# Patient Record
Sex: Male | Born: 1960
Health system: Southern US, Community
[De-identification: ages and names within clinical notes are randomized; demographics above are authoritative.]

## PROBLEM LIST (undated history)

## (undated) ENCOUNTER — Ambulatory Visit (HOSPITAL_COMMUNITY): Admission: EM | Discharge status: Against Medical Advice | Payer: Medicare Other

## (undated) DIAGNOSIS — J45909 Unspecified asthma, uncomplicated: Secondary | ICD-10-CM

## (undated) DIAGNOSIS — H543 Unqualified visual loss, both eyes: Secondary | ICD-10-CM

## (undated) HISTORY — PX: CYST EXCISION: SHX5701

---

## 2007-05-16 ENCOUNTER — Emergency Department (HOSPITAL_COMMUNITY): Admission: EM | Admit: 2007-05-16 | Discharge: 2007-05-16 | Payer: Self-pay | Admitting: Emergency Medicine

## 2007-08-14 ENCOUNTER — Emergency Department (HOSPITAL_COMMUNITY): Admission: EM | Admit: 2007-08-14 | Discharge: 2007-08-14 | Payer: Self-pay | Admitting: Family Medicine

## 2007-09-19 ENCOUNTER — Emergency Department (HOSPITAL_COMMUNITY): Admission: EM | Admit: 2007-09-19 | Discharge: 2007-09-19 | Payer: Self-pay | Admitting: Family Medicine

## 2007-12-25 ENCOUNTER — Emergency Department (HOSPITAL_COMMUNITY): Admission: EM | Admit: 2007-12-25 | Discharge: 2007-12-25 | Payer: Self-pay | Admitting: Emergency Medicine

## 2008-05-16 ENCOUNTER — Emergency Department (HOSPITAL_COMMUNITY): Admission: EM | Admit: 2008-05-16 | Discharge: 2008-05-16 | Payer: Self-pay | Admitting: Family Medicine

## 2008-11-18 ENCOUNTER — Emergency Department (HOSPITAL_COMMUNITY): Admission: EM | Admit: 2008-11-18 | Discharge: 2008-11-18 | Payer: Self-pay | Admitting: Emergency Medicine

## 2009-04-29 ENCOUNTER — Emergency Department (HOSPITAL_COMMUNITY): Admission: EM | Admit: 2009-04-29 | Discharge: 2009-04-29 | Payer: Self-pay | Admitting: Emergency Medicine

## 2009-05-21 ENCOUNTER — Emergency Department (HOSPITAL_COMMUNITY): Admission: EM | Admit: 2009-05-21 | Discharge: 2009-05-21 | Payer: Self-pay | Admitting: Emergency Medicine

## 2009-05-25 ENCOUNTER — Emergency Department (HOSPITAL_COMMUNITY): Admission: EM | Admit: 2009-05-25 | Discharge: 2009-05-25 | Payer: Self-pay | Admitting: Family Medicine

## 2009-12-17 DIAGNOSIS — H543 Unqualified visual loss, both eyes: Secondary | ICD-10-CM

## 2009-12-17 HISTORY — DX: Unqualified visual loss, both eyes: H54.3

## 2010-03-13 ENCOUNTER — Emergency Department (HOSPITAL_COMMUNITY): Admission: EM | Admit: 2010-03-13 | Discharge: 2010-03-13 | Payer: Self-pay | Admitting: Family Medicine

## 2010-03-26 ENCOUNTER — Emergency Department (HOSPITAL_COMMUNITY): Admission: EM | Admit: 2010-03-26 | Discharge: 2010-03-26 | Payer: Self-pay | Admitting: Family Medicine

## 2010-04-24 ENCOUNTER — Emergency Department (HOSPITAL_COMMUNITY): Admission: EM | Admit: 2010-04-24 | Discharge: 2010-04-24 | Payer: Self-pay | Admitting: Emergency Medicine

## 2010-04-27 ENCOUNTER — Emergency Department (HOSPITAL_COMMUNITY): Admission: EM | Admit: 2010-04-27 | Discharge: 2010-04-27 | Payer: Self-pay | Admitting: Emergency Medicine

## 2010-05-01 ENCOUNTER — Emergency Department (HOSPITAL_COMMUNITY): Admission: EM | Admit: 2010-05-01 | Discharge: 2010-05-01 | Payer: Self-pay | Admitting: Emergency Medicine

## 2010-08-08 ENCOUNTER — Ambulatory Visit: Payer: Self-pay | Admitting: Vascular Surgery

## 2010-08-08 ENCOUNTER — Encounter (INDEPENDENT_AMBULATORY_CARE_PROVIDER_SITE_OTHER): Payer: Self-pay | Admitting: Emergency Medicine

## 2010-08-08 ENCOUNTER — Emergency Department (HOSPITAL_COMMUNITY): Admission: EM | Admit: 2010-08-08 | Discharge: 2010-08-08 | Payer: Self-pay | Admitting: Emergency Medicine

## 2011-02-07 ENCOUNTER — Encounter (INDEPENDENT_AMBULATORY_CARE_PROVIDER_SITE_OTHER): Payer: Self-pay | Admitting: *Deleted

## 2011-03-02 LAB — COMPREHENSIVE METABOLIC PANEL
ALT: 36 U/L (ref 0–53)
AST: 28 U/L (ref 0–37)
Albumin: 3.6 g/dL (ref 3.5–5.2)
BUN: 10 mg/dL (ref 6–23)
Calcium: 8.8 mg/dL (ref 8.4–10.5)
Creatinine, Ser: 0.87 mg/dL (ref 0.4–1.5)
Glucose, Bld: 112 mg/dL — ABNORMAL HIGH (ref 70–99)
Total Bilirubin: 0.5 mg/dL (ref 0.3–1.2)
Total Protein: 6.9 g/dL (ref 6.0–8.3)

## 2011-03-02 LAB — CBC
Hemoglobin: 13.6 g/dL (ref 13.0–17.0)
MCH: 29.8 pg (ref 26.0–34.0)
MCV: 86.2 fL (ref 78.0–100.0)
Platelets: 277 10*3/uL (ref 150–400)
RDW: 14.4 % (ref 11.5–15.5)

## 2011-03-02 LAB — DIFFERENTIAL
Lymphs Abs: 1.5 10*3/uL (ref 0.7–4.0)
Monocytes Relative: 8 % (ref 3–12)

## 2011-03-02 LAB — URINALYSIS, ROUTINE W REFLEX MICROSCOPIC
Hgb urine dipstick: NEGATIVE
Protein, ur: NEGATIVE mg/dL
Specific Gravity, Urine: 1.02 (ref 1.005–1.030)
pH: 5.5 (ref 5.0–8.0)

## 2011-03-02 LAB — D-DIMER, QUANTITATIVE: D-Dimer, Quant: 2.11 ug/mL-FEU — ABNORMAL HIGH (ref 0.00–0.48)

## 2011-03-06 LAB — DIFFERENTIAL
Basophils Absolute: 0 10*3/uL (ref 0.0–0.1)
Basophils Relative: 0 % (ref 0–1)
Basophils Relative: 1 % (ref 0–1)
Eosinophils Absolute: 0.1 10*3/uL (ref 0.0–0.7)
Eosinophils Relative: 1 % (ref 0–5)
Eosinophils Relative: 1 % (ref 0–5)
Lymphocytes Relative: 12 % (ref 12–46)
Lymphocytes Relative: 18 % (ref 12–46)
Lymphocytes Relative: 19 % (ref 12–46)
Lymphs Abs: 1 10*3/uL (ref 0.7–4.0)
Monocytes Absolute: 0.2 10*3/uL (ref 0.1–1.0)
Monocytes Absolute: 0.2 10*3/uL (ref 0.1–1.0)
Monocytes Relative: 4 % (ref 3–12)
Monocytes Relative: 4 % (ref 3–12)
Neutro Abs: 4 10*3/uL (ref 1.7–7.7)
Neutro Abs: 4.1 10*3/uL (ref 1.7–7.7)
Neutrophils Relative %: 77 % (ref 43–77)

## 2011-03-06 LAB — CBC
HCT: 39.3 % (ref 39.0–52.0)
HCT: 42.8 % (ref 39.0–52.0)
Hemoglobin: 13.6 g/dL (ref 13.0–17.0)
MCHC: 34.7 g/dL (ref 30.0–36.0)
MCHC: 35.1 g/dL (ref 30.0–36.0)
MCV: 88.4 fL (ref 78.0–100.0)
MCV: 88.8 fL (ref 78.0–100.0)
MCV: 89.1 fL (ref 78.0–100.0)
Platelets: 125 10*3/uL — ABNORMAL LOW (ref 150–400)
RDW: 13.8 % (ref 11.5–15.5)
RDW: 14.1 % (ref 11.5–15.5)
RDW: 14.1 % (ref 11.5–15.5)
WBC: 5.2 10*3/uL (ref 4.0–10.5)

## 2011-03-06 LAB — BASIC METABOLIC PANEL
BUN: 9 mg/dL (ref 6–23)
Chloride: 100 mEq/L (ref 96–112)
Creatinine, Ser: 1.04 mg/dL (ref 0.4–1.5)

## 2011-03-06 LAB — COMPREHENSIVE METABOLIC PANEL
Albumin: 3.4 g/dL — ABNORMAL LOW (ref 3.5–5.2)
BUN: 13 mg/dL (ref 6–23)
Creatinine, Ser: 1.28 mg/dL (ref 0.4–1.5)
GFR calc Af Amer: >60 mL/min (ref >60)
Total Protein: 7.4 g/dL (ref 6.0–8.3)

## 2011-03-06 LAB — POCT I-STAT, CHEM 8
Creatinine, Ser: 1.3 mg/dL (ref 0.4–1.5)
HCT: 45 % (ref 39.0–52.0)
Sodium: 129 mEq/L — ABNORMAL LOW (ref 135–145)

## 2011-03-06 LAB — SEDIMENTATION RATE: Sed Rate: 55 mm/hr — ABNORMAL HIGH (ref 0–16)

## 2012-05-23 ENCOUNTER — Encounter (HOSPITAL_COMMUNITY): Payer: Self-pay | Admitting: *Deleted

## 2012-05-23 ENCOUNTER — Emergency Department (HOSPITAL_COMMUNITY)
Admission: EM | Admit: 2012-05-23 | Discharge: 2012-05-24 | Disposition: A | Discharge status: Home / Self Care | Payer: Medicaid Other | Attending: Emergency Medicine | Admitting: Emergency Medicine

## 2012-05-23 ENCOUNTER — Emergency Department (HOSPITAL_COMMUNITY): Payer: Medicaid Other

## 2012-05-23 DIAGNOSIS — R5381 Other malaise: Secondary | ICD-10-CM | POA: Insufficient documentation

## 2012-05-23 DIAGNOSIS — M545 Low back pain, unspecified: Secondary | ICD-10-CM | POA: Insufficient documentation

## 2012-05-23 DIAGNOSIS — IMO0002 Reserved for concepts with insufficient information to code with codable children: Secondary | ICD-10-CM | POA: Insufficient documentation

## 2012-05-23 DIAGNOSIS — X500XXA Overexertion from strenuous movement or load, initial encounter: Secondary | ICD-10-CM | POA: Insufficient documentation

## 2012-05-23 DIAGNOSIS — S3992XA Unspecified injury of lower back, initial encounter: Secondary | ICD-10-CM

## 2012-05-23 DIAGNOSIS — H543 Unqualified visual loss, both eyes: Secondary | ICD-10-CM | POA: Insufficient documentation

## 2012-05-23 DIAGNOSIS — R609 Edema, unspecified: Secondary | ICD-10-CM | POA: Insufficient documentation

## 2012-05-23 HISTORY — DX: Unspecified asthma, uncomplicated: J45.909

## 2012-05-23 HISTORY — DX: Unqualified visual loss, both eyes: H54.3

## 2012-05-23 MED ORDER — OXYCODONE-ACETAMINOPHEN 5-325 MG PO TABS
1.0000 | ORAL_TABLET | Freq: Once | ORAL | Status: AC
  Administered 2012-05-23: 1 via ORAL
  Filled 2012-05-23: qty 1

## 2012-05-23 MED ORDER — PREDNISONE 20 MG PO TABS
60.0000 mg | ORAL_TABLET | Freq: Once | ORAL | Status: AC
  Administered 2012-05-23: 60 mg via ORAL
  Filled 2012-05-23: qty 3

## 2012-05-23 MED ORDER — DIAZEPAM 5 MG PO TABS
5.0000 mg | ORAL_TABLET | Freq: Once | ORAL | Status: AC
  Administered 2012-05-23: 5 mg via ORAL
  Filled 2012-05-23: qty 1

## 2012-05-23 NOTE — ED Notes (Addendum)
Per EMS, pt from home, reports R lower back pain x 2 days, states pain is so severe that he is unable to stand up.  This pain, as reported, makes his RT leg weak and tingly.  VS by EMS: 124/90, 74, 18.  PT reported to EMS he can't amulate or tolerate sitting.

## 2012-05-23 NOTE — ED Notes (Signed)
Pt unable to make it more than 3-4 steps without pain and feeling like he's going to fall.  NP Dondra Spry made aware.

## 2012-05-23 NOTE — ED Provider Notes (Signed)
History     CSN: [id number]  Arrival date & time 05/23/12  1740   First MD Initiated Contact with Patient 05/23/12 2055      Chief Complaint  Patient presents with  . Back Pain    R lower back pain    (Consider location/radiation/quality/duration/timing/severity/associated sxs/prior treatment) HPI Comments: Patient reports, that when he was getting out of bed 2 days ago.  He felt a sharp, hop in his lower back since that time.  He has had constant pain with intermittent episodes of sudden weakness, where he has had to catch himself while ambulating.  Position doesn't seem to exacerbate the pain.  Nothing alleviates the pain has not taken any over-the-counter medication for this discomfort.  He was at his neurologist office yesterday for a "eye exam" and did not mention his low back pain to her.  He does not have her primary care physician  Patient is a 51 y.o. male presenting with back pain. The history is provided by the patient.  Back Pain  This is a new problem. The current episode started 2 days ago. The problem occurs constantly. The problem has not changed since onset.The pain is associated with twisting. The pain is present in the lumbar spine. The quality of the pain is described as stabbing. The pain radiates to the left thigh and right thigh. The pain is at a severity of 8/10. The pain is severe. The symptoms are aggravated by certain positions. Stiffness is present all day. Associated symptoms include leg pain and weakness. Pertinent negatives include no fever, no headaches, no bowel incontinence, no perianal numbness, no dysuria and no paresthesias. He has tried nothing for the symptoms. Risk factors include obesity.    Past Medical History  Diagnosis Date  . Asthma   . Blindness of both eyes     History reviewed. No pertinent past surgical history.  History reviewed. No pertinent family history.  History  Substance Use Topics  . Smoking status: Current Everyday Smoker  -- 0.5 packs/day  . Smokeless tobacco: Not on file  . Alcohol Use: Yes     occasionally      Review of Systems  Constitutional: Negative for fever.  Respiratory: Negative for cough and shortness of breath.   Gastrointestinal: Negative for nausea and bowel incontinence.  Genitourinary: Negative for dysuria, urgency, frequency, flank pain, decreased urine volume and testicular pain.  Musculoskeletal: Positive for back pain and gait problem.  Neurological: Positive for weakness. Negative for dizziness, headaches and paresthesias.    Allergies  Review of patient's allergies indicates no known allergies.  Home Medications   Current Outpatient Rx  Name Route Sig Dispense Refill  . DIAZEPAM 5 MG PO TABS Oral Take 1 tablet (5 mg total) by mouth every 8 (eight) hours as needed for anxiety. 30 tablet 0  . OXYCODONE-ACETAMINOPHEN 5-325 MG PO TABS Oral Take 1 tablet by mouth every 6 (six) hours as needed for pain. 30 tablet 0  . PREDNISONE 20 MG PO TABS Oral Take 3 tablets (60 mg total) by mouth daily. 12 tablet 0    BP 113/74  Pulse 89  Resp 15  SpO2 98%  Physical Exam  Constitutional: He appears well-developed and well-nourished.  HENT:  Head: Normocephalic and atraumatic.  Eyes:       Total blindness L legally blind in R eye  Neck: Normal range of motion.  Cardiovascular: Normal rate.   Pulmonary/Chest: Effort normal.  Abdominal: Soft. He exhibits no distension.  Musculoskeletal: He  exhibits edema.       Edema of lower legs that has been persistant for thwe past 6 months   Skin: Skin is warm.    ED Course  Procedures (including critical care time)  Labs Reviewed - No data to display Dg Lumbar Spine Complete  05/23/2012  *RADIOLOGY REPORT*  Clinical Data: Low back pain, radiculopathy  LUMBAR SPINE - COMPLETE 4+ VIEW  Comparison: CT abdomen pelvis dated 11/18/2008  Findings: Five lumbar-type vertebral bodies.  Normal lumbar lordosis.  No evidence of fracture or dislocation.   The vertebral body heights are maintained.  Mild degenerative changes of the lower lumbar spine.  Visualized bony pelvis appears intact.  IMPRESSION: No fracture or dislocation is seen.  Mild degenerative changes of the lower lumbar spine.  Original Report Authenticated By: Charline Bills, M.D.     1. Back injury    Patient was able to ambulate in the hall without difficulty   MDM  From the history presented most likely lumbar radiculopathy         Arman Filter, NP 05/24/12 0148  Arman Filter, NP 05/24/12 1610

## 2012-05-23 NOTE — ED Notes (Signed)
Pt also states for the last month he has been having stabbing pains to bilat heels.  Pt is blind in RT eye and almost blind in the LT.  Denies hx of diabetes.

## 2012-05-24 MED ORDER — OXYCODONE-ACETAMINOPHEN 5-325 MG PO TABS: 1.0000 | ORAL_TABLET | Freq: Four times a day (QID) | ORAL | Status: AC | PRN

## 2012-05-24 MED ORDER — DIAZEPAM 5 MG PO TABS: 5.0000 mg | ORAL_TABLET | Freq: Three times a day (TID) | ORAL | Status: AC | PRN

## 2012-05-24 MED ORDER — PREDNISONE 20 MG PO TABS: 60.0000 mg | ORAL_TABLET | Freq: Every day | ORAL | Status: AC

## 2012-05-24 NOTE — ED Provider Notes (Signed)
Medical screening examination/treatment/procedure(s) were performed by non-physician practitioner and as supervising physician I was immediately available for consultation/collaboration.  Doug Sou, MD 05/24/12 1455

## 2012-05-24 NOTE — ED Notes (Signed)
[facility] paged. 

## 2012-05-24 NOTE — ED Notes (Signed)
Pt able to ambulate in hallway.  NP Dondra Spry made aware.  Pt to be discharged.

## 2013-10-01 ENCOUNTER — Encounter (HOSPITAL_COMMUNITY): Payer: Self-pay | Admitting: Emergency Medicine

## 2013-10-01 ENCOUNTER — Emergency Department (HOSPITAL_COMMUNITY)
Admission: EM | Admit: 2013-10-01 | Discharge: 2013-10-01 | Disposition: A | Discharge status: Home / Self Care | Payer: Medicare Other | Attending: Emergency Medicine | Admitting: Emergency Medicine

## 2013-10-01 DIAGNOSIS — J45909 Unspecified asthma, uncomplicated: Secondary | ICD-10-CM | POA: Insufficient documentation

## 2013-10-01 DIAGNOSIS — M7989 Other specified soft tissue disorders: Secondary | ICD-10-CM | POA: Insufficient documentation

## 2013-10-01 DIAGNOSIS — T7840XA Allergy, unspecified, initial encounter: Secondary | ICD-10-CM

## 2013-10-01 DIAGNOSIS — R21 Rash and other nonspecific skin eruption: Secondary | ICD-10-CM | POA: Insufficient documentation

## 2013-10-01 DIAGNOSIS — L509 Urticaria, unspecified: Secondary | ICD-10-CM | POA: Insufficient documentation

## 2013-10-01 DIAGNOSIS — R209 Unspecified disturbances of skin sensation: Secondary | ICD-10-CM | POA: Insufficient documentation

## 2013-10-01 DIAGNOSIS — F17200 Nicotine dependence, unspecified, uncomplicated: Secondary | ICD-10-CM | POA: Insufficient documentation

## 2013-10-01 DIAGNOSIS — Z8669 Personal history of other diseases of the nervous system and sense organs: Secondary | ICD-10-CM | POA: Insufficient documentation

## 2013-10-01 MED ORDER — DIPHENHYDRAMINE HCL 50 MG/ML IJ SOLN
25.0000 mg | Freq: Once | INTRAMUSCULAR | Status: AC
  Administered 2013-10-01: 25 mg via INTRAVENOUS
  Filled 2013-10-01: qty 1

## 2013-10-01 MED ORDER — FAMOTIDINE IN NACL 20-0.9 MG/50ML-% IV SOLN
20.0000 mg | Freq: Once | INTRAVENOUS | Status: AC
  Administered 2013-10-01: 20 mg via INTRAVENOUS
  Filled 2013-10-01: qty 50

## 2013-10-01 MED ORDER — SODIUM CHLORIDE 0.9 % IV BOLUS (SEPSIS)
1000.0000 mL | Freq: Once | INTRAVENOUS | Status: AC
  Administered 2013-10-01: 1000 mL via INTRAVENOUS

## 2013-10-01 MED ORDER — DEXAMETHASONE SODIUM PHOSPHATE 10 MG/ML IJ SOLN
10.0000 mg | Freq: Once | INTRAMUSCULAR | Status: AC
  Administered 2013-10-01: 10 mg via INTRAVENOUS
  Filled 2013-10-01: qty 1

## 2013-10-01 NOTE — ED Provider Notes (Signed)
CSN: [id number]     Arrival date & time 10/01/13  0147 History   First MD Initiated Contact with Patient 10/01/13 0155     Chief Complaint  Patient presents with  . Allergic Reaction   (Consider location/radiation/quality/duration/timing/severity/associated sxs/prior Treatment) The history is provided by the patient and medical records.   This is a 52 year old male with past medical history significant for bilateral blindness and asthma, presenting to the ED for allergic reaction. Patient states around 10 PM he began having itching, swelling, and burning sensation on both of his hands. Patient does not recall direct contact with chemicals. 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 Patient denies any recent insect bite or plant exposure.  Patient was given 50 of Benadryl in route with some relief. Denies any shortness of breath, difficulty swallowing, or sensation of airway collapse. Patient has no known allergies.   Past Medical History  Diagnosis Date  . Asthma   . Blindness of both eyes    History reviewed. No pertinent past surgical history. No family history on file. History  Substance Use Topics  . Smoking status: Current Every Day Smoker -- 0.50 packs/day  . Smokeless tobacco: Not on file  . Alcohol Use: Yes     Comment: occasionally    Review of Systems  Skin: Positive for rash.  All other systems reviewed and are negative.    Allergies  Review of patient's allergies indicates no known allergies.  Home Medications  No current outpatient prescriptions on file. BP 105/54  Pulse 73  Temp(Src) 98 F (36.7 C) (Oral)  Resp 18  SpO2 97%  Physical Exam  Nursing note and vitals reviewed. Constitutional: He is oriented to person, place, and time. He appears well-developed and well-nourished. No distress.  HENT:  Head: Normocephalic and atraumatic.  Mouth/Throat: Uvula is midline,  oropharynx is clear and moist and mucous membranes are normal. No oral lesions. No uvula swelling. No oropharyngeal exudate, posterior oropharyngeal edema or posterior oropharyngeal erythema.  Oropharynx clear; no edema; airway patent; handling secretions appropriately  Eyes: Conjunctivae and EOM are normal. Pupils are equal, round, and reactive to light.  Neck: Normal range of motion. Neck supple.  Cardiovascular: Normal rate, regular rhythm and normal heart sounds.   Pulmonary/Chest: Effort normal and breath sounds normal. No respiratory distress. He has no wheezes.  Musculoskeletal: Normal range of motion.  Neurological: He is alert and oriented to person, place, and time.  Skin: Skin is warm and dry. Rash noted. Rash is urticarial. He is not diaphoretic.  Urticarial rash on bilateral hands and forearms; some swelling of hands; no chemical burns present  Psychiatric: He has a normal mood and affect.    ED Course  Procedures (including critical care time) Labs Review Labs Reviewed - No data to display Imaging Review No results found.  EKG Interpretation   None       MDM   1. Allergic reaction, initial encounter    Hives of both hands and forearm, no chemical burns identified.  Benadryl, pepcid, and decadron given.  Will re-assess.  5:25 AM Hives resolved, swelling greatly improved.  Minimal swelling of bilateral palms, worst along thenar eminences.  Pt afebrile, non-toxic appearing, NAD, VS stable- ok for discharge.  Instructed to continue taking OTC benadryl PRN itching.  May FU with [facility] if sx not completely resolved in a few days.  Discussed plan with pt, he agreed.  Return precautions advised.  Garlon Hatchet, PA-C 10/01/13 [phone]

## 2013-10-01 NOTE — ED Notes (Signed)
Bed: NW29 Expected date:  Expected time:  Means of arrival:  Comments: Bed 15, EMS, 19 M, Allergic Rxn

## 2013-10-01 NOTE — ED Notes (Signed)
[facility] called for transport.  

## 2013-10-01 NOTE — ED Notes (Signed)
Pt presents by EMS with c/o allergic reaction. Pt is unsure of what he had a reaction to but he started noticing symptoms around 10 pm last night. Pt says he noticed itching on his hands. Per EMS, pt is blind. Per EMS, pt does have swelling and hives on his hands. Pt given 50mg  PO benadryl en route by EMS.

## 2013-10-02 NOTE — ED Provider Notes (Signed)
Medical screening examination/treatment/procedure(s) were performed by non-physician practitioner and as supervising physician I was immediately available for consultation/collaboration.  Toy Baker, MD 10/02/13 [phone]

## 2013-10-06 ENCOUNTER — Encounter (HOSPITAL_COMMUNITY): Payer: Self-pay | Admitting: Emergency Medicine

## 2013-10-06 ENCOUNTER — Emergency Department (INDEPENDENT_AMBULATORY_CARE_PROVIDER_SITE_OTHER)
Admission: EM | Admit: 2013-10-06 | Discharge: 2013-10-06 | Disposition: A | Discharge status: Home / Self Care | Payer: Medicare Other | Source: Home / Self Care | Attending: Family Medicine | Admitting: Family Medicine

## 2013-10-06 ENCOUNTER — Emergency Department (INDEPENDENT_AMBULATORY_CARE_PROVIDER_SITE_OTHER): Payer: Medicare Other

## 2013-10-06 DIAGNOSIS — M65839 Other synovitis and tenosynovitis, unspecified forearm: Secondary | ICD-10-CM

## 2013-10-06 LAB — CBC WITH DIFFERENTIAL/PLATELET
Basophils Absolute: 0 10*3/uL (ref 0.0–0.1)
Basophils Relative: 0 % (ref 0–1)
Eosinophils Absolute: 0.1 10*3/uL (ref 0.0–0.7)
Eosinophils Relative: 2 % (ref 0–5)
Lymphocytes Relative: 38 % (ref 12–46)
MCV: 84.5 fL (ref 78.0–100.0)
Platelets: 255 10*3/uL (ref 150–400)
RDW: 13.3 % (ref 11.5–15.5)
WBC: 7.7 10*3/uL (ref 4.0–10.5)

## 2013-10-06 LAB — BASIC METABOLIC PANEL
Calcium: 9.5 mg/dL (ref 8.4–10.5)
GFR calc Af Amer: >90 mL/min (ref >90)
GFR calc non Af Amer: >90 mL/min (ref >90)
Sodium: 136 mEq/L (ref 135–145)

## 2013-10-06 MED ORDER — CEPHALEXIN 500 MG PO CAPS: 500.0000 mg | ORAL_CAPSULE | Freq: Four times a day (QID) | ORAL | Status: DC

## 2013-10-06 MED ORDER — METHYLPREDNISOLONE ACETATE 80 MG/ML IJ SUSP
INTRAMUSCULAR | Status: AC
  Filled 2013-10-06: qty 1

## 2013-10-06 MED ORDER — PERMETHRIN 5 % EX CREA: TOPICAL_CREAM | CUTANEOUS | Status: DC

## 2013-10-06 MED ORDER — DICLOFENAC 18 MG PO CAPS: 18.0000 mg | ORAL_CAPSULE | Freq: Three times a day (TID) | ORAL | Status: DC

## 2013-10-06 MED ORDER — METHYLPREDNISOLONE 4 MG PO KIT: PACK | ORAL | Status: DC

## 2013-10-06 MED ORDER — METHYLPREDNISOLONE ACETATE 40 MG/ML IJ SUSP
80.0000 mg | Freq: Once | INTRAMUSCULAR | Status: AC
  Administered 2013-10-06: 80 mg via INTRAMUSCULAR

## 2013-10-06 NOTE — ED Notes (Signed)
C/o right hand pain.   Patient was seen at [facility] for the hand pain which he thought was an allergic reaction.  Patient states he has not used any new products or food.  Patient states hand is real tender and that the left hand is starting to hurt.

## 2013-10-06 NOTE — ED Provider Notes (Signed)
CSN: [id number]     Arrival date & time 10/06/13  1447 History   First MD Initiated Contact with Patient 10/06/13 1633     Chief Complaint  Patient presents with  . Hand Pain   (Consider location/radiation/quality/duration/timing/severity/associated sxs/prior Treatment) Patient is a 52 y.o. male presenting with hand pain. The history is provided by the patient.  Hand Pain This is a new problem. 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 The problem has been gradually worsening.    Past Medical History  Diagnosis Date  . Asthma   . Blindness of both eyes    History reviewed. No pertinent past surgical history. History reviewed. No pertinent family history. History  Substance Use Topics  . Smoking status: Current Every Day Smoker -- 0.50 packs/day  . Smokeless tobacco: Not on file  . Alcohol Use: Yes     Comment: occasionally    Review of Systems  Constitutional: Negative.   Musculoskeletal: Positive for joint swelling.  Skin: Positive for rash.    Allergies  Review of patient's allergies indicates no known allergies.  Home Medications   Current Outpatient Rx  Name  Route  Sig  Dispense  Refill  . cephALEXin (KEFLEX) 500 MG capsule   Oral   Take 1 capsule (500 mg total) by mouth 4 (four) times daily. Take all of medicine and drink lots of fluids   20 capsule   0   . methylPREDNISolone (MEDROL DOSEPAK) 4 MG tablet      follow package directions, start on wed, take until finished.   21 tablet   0   . permethrin (ELIMITE) 5 % cream      Use as instructed on bottle, please instruct as pt is blind.   60 g   1    BP 107/77  Pulse 70  Temp(Src) 98.8 F (37.1 C) (Oral)  Resp 16  SpO2 100% Physical Exam  Nursing note and vitals reviewed. Constitutional: He is oriented to person, place, and time. He appears well-developed and well-nourished.  Musculoskeletal: He exhibits  tenderness.  Neurological: He is alert and oriented to person, place, and time.  Skin: Skin is warm and dry. No rash noted. There is erythema. No pallor.  Mid palmar tendon sheath erythema and tenderness,fingers intact, nv fxn intact.    ED Course  Procedures (including critical care time) Labs Review Labs Reviewed  CBC WITH DIFFERENTIAL  BASIC METABOLIC PANEL   Imaging Review Dg Hand Complete Right  10/06/2013   CLINICAL DATA:  Right hand pain without injury.  EXAM: RIGHT HAND - COMPLETE 3+ VIEW  COMPARISON:  None.  FINDINGS: There is no evidence of fracture or dislocation. There is no evidence of arthropathy or other focal bone abnormality. Soft tissues are unremarkable.  IMPRESSION: Negative.   Electronically Signed   By: Roque Lias M.D.   On: 10/06/2013 18:35      MDM  Discussed with dr Amanda Pea, plans as noted.seen in consult by dr Amanda Pea.    Linna Hoff, MD 10/06/13 [phone]

## 2013-10-07 NOTE — Consult Note (Signed)
NAMETRAMPAS, STETTNER                ACCOUNT NO.:  [id number]  MEDICAL RECORD NO.:  [id number]  LOCATION:  [facility]                         FACILITY:  [facility]  PHYSICIAN:  Dionne Ano. Marsean Elkhatib, M.D.DATE OF BIRTH:  08/09/1961  DATE OF CONSULTATION: DATE OF DISCHARGE:  10/06/2013                                CONSULTATION   REASON FOR CONSULT:  Palm pain, right hand.  HISTORY:  The patient is a 52 year old male who has impaired eyesight. He states that he has had difficulty with bed bugs at the [facility] where he was staying and notes that this is improving.  He also noted pain in his right palm.  Dr. Artis Flock was worried about this and asked me to comment and see him.  We came to evaluate him.  The patient denies neck, back, chest, abdominal pain.  He denies locking, popping, catching.  He notes no numbness or tingling in the upper extremity.  I have reviewed this issue with him at length and findings.  At present time, his medical and surgical history are reviewed. Medicines, allergies, etc. are reviewed at length.  SOCIAL HISTORY:  Reviewed in detail in his chart, which is available in epic.  PHYSICAL EXAMINATION:  His examination is relatively benign from hand surgery standpoint.  He has some mild palmar erythema.  No evidence of mid palmar space abscess.  Thenar, hypothenar, and mid palmar space were all nicely soft and nontender.  FDP, FDS and extensor function is intact.  There is no signs of dystrophy, no signs of infection, and no signs of gross instability.  I have reviewed this with him at length. Normal pulses.  He has excoriations throughout his arms, which is remarkable, and I discussed this with he and Dr. Artis Flock.  I do not see any compartment syndrome, dystrophy, infection, or evidence of acute hand surgery emergency.  At the present time, I would note that his x-rays are negative.  IMPRESSION: 1. Multiple skin excoriations. 2. Palmar erythema.  No evidence of  abscess or advanced flexor     tenosynovitis.  RECOMMENDATIONS:  I would treat him for the excoriations with Keflex to prevent super seeding the areas with infection.  I would also recommend possibly a Medrol dose pack to decrease the inflammation.  Topical lotion to the skin was discussed with he and Dr. Artis Flock.  I will be immediately available for review if he worsens; otherwise, I do not think he needs any further aggressive surgical care at this juncture.  I have reviewed his exam, his x-rays, and other issues as they are to remain pertinent to his upper extremity predicament.  It was a pleasure to see him today.  Do's and she patient.     Dionne Ano. Amanda Pea, M.D.     [facility]  D:  10/06/2013  T:  10/07/2013  Job:  [id number]

## 2015-01-11 ENCOUNTER — Emergency Department (INDEPENDENT_AMBULATORY_CARE_PROVIDER_SITE_OTHER)
Admission: EM | Admit: 2015-01-11 | Discharge: 2015-01-11 | Disposition: A | Discharge status: Home / Self Care | Payer: Medicare Other | Source: Home / Self Care | Attending: Family Medicine | Admitting: Family Medicine

## 2015-01-11 ENCOUNTER — Encounter (HOSPITAL_COMMUNITY): Payer: Self-pay | Admitting: Emergency Medicine

## 2015-01-11 DIAGNOSIS — K0530 Chronic periodontitis, unspecified: Secondary | ICD-10-CM

## 2015-01-11 MED ORDER — KETOROLAC TROMETHAMINE 60 MG/2ML IM SOLN
INTRAMUSCULAR | Status: AC
  Filled 2015-01-11: qty 2

## 2015-01-11 MED ORDER — AMOXICILLIN 500 MG PO CAPS: 500.0000 mg | ORAL_CAPSULE | Freq: Two times a day (BID) | ORAL | Status: DC

## 2015-01-11 MED ORDER — KETOROLAC TROMETHAMINE 60 MG/2ML IM SOLN
60.0000 mg | Freq: Once | INTRAMUSCULAR | Status: AC
  Administered 2015-01-11: 60 mg via INTRAMUSCULAR
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
## 2015-01-11 NOTE — ED Notes (Signed)
Swelling noted to left jaw.

## 2015-01-11 NOTE — ED Provider Notes (Signed)
CSN: [id number]     Arrival date & time 01/11/15  1222 History   First MD Initiated Contact with Patient 01/11/15 1341     Chief Complaint  Patient presents with  . Jaw Pain   (Consider location/radiation/quality/duration/timing/severity/associated sxs/prior Treatment) HPI  L jaw pain: started yesterday. Radiates up head and down neck. Getting worse. "feels like i'm getting punched adn stabbed at the same time." Has not tried anything to make it better. Unsure of what makes it worse. Denies CP, fevers, SOB, palpitions, HA. Associated w/ dizziness and lightheadedness.   Blindness: pt became blind after stroke or viral infection 5 years ago.   Past Medical History  Diagnosis Date  . Asthma   . Blindness of both eyes    History reviewed. No pertinent past surgical history. No family history on file. History  Substance Use Topics  . Smoking status: Current Every Day Smoker -- 0.50 packs/day  . Smokeless tobacco: Not on file  . Alcohol Use: Yes     Comment: occasionally    Review of Systems Per HPI with all other pertinent systems negative.   Allergies  Review of patient's allergies indicates no known allergies.  Home Medications   Prior to Admission medications   Medication Sig Start Date End Date Taking? Authorizing Provider  amoxicillin (AMOXIL) 500 MG capsule Take 1 capsule (500 mg total) by mouth 2 (two) times daily. 01/11/15   Ozella Rocksavid J Merrell, MD  permethrin (ELIMITE) 5 % cream Use as instructed on bottle, please instruct as pt is blind. 10/06/13   Linna HoffJames D Kindl, MD   BP 120/70 mmHg  Pulse 85  Temp(Src) 98.8 F (37.1 C) (Oral)  Resp 16  SpO2 96% Physical Exam  Constitutional: He is oriented to person, place, and time. He appears well-developed and well-nourished. No distress.  HENT:  1st-2nd molars w/ significant cavitation and gum swelling w/ ttp. No fluctuance or induration   Eyes: EOM are normal. Pupils are equal, round, and reactive to light.  Neck: Normal  range of motion. Neck supple.  Cardiovascular: Normal rate and normal heart sounds.   No murmur heard. Pulmonary/Chest: Effort normal and breath sounds normal.  Abdominal: Soft. Bowel sounds are normal.  Musculoskeletal: Normal range of motion. He exhibits no edema or tenderness.  Neurological: He is alert and oriented to person, place, and time.  Skin: Skin is warm and dry. He is not diaphoretic.  Psychiatric: His behavior is normal. Judgment and thought content normal.    ED Course  Procedures (including critical care time) Labs Review Labs Reviewed - No data to display  Imaging Review No results found.   MDM   1. Periodontitis    Start Amox Toradol 60mg  IM in office Restart NSAIDs in 24 hrs  Precautions given and all questions answered  Shelly Flattenavid Merrell, MD Family Medicine 01/11/2015, 2:17 PM       Ozella Rocksavid J Merrell, MD 01/11/15 [phone]

## 2015-01-11 NOTE — Discharge Instructions (Signed)
You likely have a dental and gum infection This will need antibiotics to clear In 24 hours please start ibuprofen 600mg  every 6 hours Please brush your teeth twice daily and use a daily mouthwash

## 2015-08-28 ENCOUNTER — Emergency Department (HOSPITAL_COMMUNITY)
Admission: EM | Admit: 2015-08-28 | Discharge: 2015-08-28 | Disposition: A | Discharge status: Home / Self Care | Payer: Medicare Other | Attending: Emergency Medicine | Admitting: Emergency Medicine

## 2015-08-28 ENCOUNTER — Encounter (HOSPITAL_COMMUNITY): Payer: Self-pay | Admitting: Emergency Medicine

## 2015-08-28 DIAGNOSIS — T24231A Burn of second degree of right lower leg, initial encounter: Secondary | ICD-10-CM | POA: Insufficient documentation

## 2015-08-28 DIAGNOSIS — Z720 Tobacco use: Secondary | ICD-10-CM | POA: Diagnosis not present

## 2015-08-28 DIAGNOSIS — Y998 Other external cause status: Secondary | ICD-10-CM | POA: Insufficient documentation

## 2015-08-28 DIAGNOSIS — X12XXXA Contact with other hot fluids, initial encounter: Secondary | ICD-10-CM | POA: Diagnosis not present

## 2015-08-28 DIAGNOSIS — Z792 Long term (current) use of antibiotics: Secondary | ICD-10-CM | POA: Diagnosis not present

## 2015-08-28 DIAGNOSIS — T23202A Burn of second degree of left hand, unspecified site, initial encounter: Secondary | ICD-10-CM | POA: Diagnosis not present

## 2015-08-28 DIAGNOSIS — T2122XA Burn of second degree of abdominal wall, initial encounter: Secondary | ICD-10-CM | POA: Insufficient documentation

## 2015-08-28 DIAGNOSIS — Y9389 Activity, other specified: Secondary | ICD-10-CM | POA: Diagnosis not present

## 2015-08-28 DIAGNOSIS — Y9289 Other specified places as the place of occurrence of the external cause: Secondary | ICD-10-CM | POA: Insufficient documentation

## 2015-08-28 DIAGNOSIS — T22231A Burn of second degree of right upper arm, initial encounter: Secondary | ICD-10-CM | POA: Diagnosis not present

## 2015-08-28 DIAGNOSIS — T23001A Burn of unspecified degree of right hand, unspecified site, initial encounter: Secondary | ICD-10-CM | POA: Diagnosis present

## 2015-08-28 DIAGNOSIS — T23201A Burn of second degree of right hand, unspecified site, initial encounter: Secondary | ICD-10-CM | POA: Insufficient documentation

## 2015-08-28 DIAGNOSIS — J45909 Unspecified asthma, uncomplicated: Secondary | ICD-10-CM | POA: Diagnosis not present

## 2015-08-28 DIAGNOSIS — T300 Burn of unspecified body region, unspecified degree: Secondary | ICD-10-CM

## 2015-08-28 MED ORDER — OXYCODONE-ACETAMINOPHEN 5-325 MG PO TABS: 1.0000 | ORAL_TABLET | Freq: Four times a day (QID) | ORAL | Status: DC | PRN

## 2015-08-28 MED ORDER — SILVER SULFADIAZINE 1 % EX CREA: 1.0000 "application " | TOPICAL_CREAM | Freq: Two times a day (BID) | CUTANEOUS | Status: DC

## 2015-08-28 MED ORDER — HYDROMORPHONE HCL 1 MG/ML IJ SOLN
1.0000 mg | Freq: Once | INTRAMUSCULAR | Status: AC
  Administered 2015-08-28: 1 mg via INTRAVENOUS
  Filled 2015-08-28: qty 1

## 2015-08-28 MED ORDER — SILVER SULFADIAZINE 1 % EX CREA
TOPICAL_CREAM | Freq: Once | CUTANEOUS | Status: AC
  Administered 2015-08-28: 1 via TOPICAL
  Filled 2015-08-28: qty 85

## 2015-08-28 MED ORDER — FENTANYL CITRATE (PF) 100 MCG/2ML IJ SOLN
100.0000 ug | Freq: Once | INTRAMUSCULAR | Status: AC
  Administered 2015-08-28: 100 ug via INTRAVENOUS
  Filled 2015-08-28: qty 2

## 2015-08-28 MED ORDER — KETOROLAC TROMETHAMINE 15 MG/ML IJ SOLN
15.0000 mg | Freq: Once | INTRAMUSCULAR | Status: AC
  Administered 2015-08-28: 15 mg via INTRAVENOUS
  Filled 2015-08-28: qty 1
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
## 2015-08-28 NOTE — Discharge Instructions (Signed)
Burn Care Your skin is a natural barrier to infection. It is the largest organ of your body. Burns damage this natural protection. To help prevent infection, it is very important to follow your caregiver's instructions in the care of your burn. Burns are classified as:  First degree. There is only redness of the skin (erythema). No scarring is expected.  Second degree. There is blistering of the skin. Scarring may occur with deeper burns.  Third degree. All layers of the skin are injured, and scarring is expected. HOME CARE INSTRUCTIONS   Wash your hands well before changing your bandage.  Change your bandage as often as directed by your caregiver.  Remove the old bandage. If the bandage sticks, you may soak it off with cool, clean water.  Cleanse the burn thoroughly but gently with mild soap and water.  Pat the area dry with a clean, dry cloth.  Apply a thin layer of antibacterial cream to the burn.  Apply a clean bandage as instructed by your caregiver.  Keep the bandage as clean and dry as possible.  Elevate the affected area for the first 24 hours, then as instructed by your caregiver.  Only take over-the-counter or prescription medicines for pain, discomfort, or fever as directed by your caregiver. SEEK IMMEDIATE MEDICAL CARE IF:   You develop excessive pain.  You develop redness, tenderness, swelling, or red streaks near the burn.  The burned area develops yellowish-white fluid (pus) or a bad smell.  You have a fever. MAKE SURE YOU:   Understand these instructions.  Will watch your condition.  Will get help right away if you are not doing well or get worse. Document Released: 12/03/2005 Document Revised: 02/25/2012 Document Reviewed: 04/25/2011 ExitCare Patient Information 2015 [employer]. This information is not intended to replace advice given to you by your health care provider. Make sure you discuss any questions you have with your health care  provider.  

## 2015-08-28 NOTE — ED Notes (Signed)
Pt stable, ambulatory, states understanding of discharge instructions 
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
## 2017-04-24 ENCOUNTER — Emergency Department (HOSPITAL_COMMUNITY)
Admission: EM | Admit: 2017-04-24 | Discharge: 2017-04-24 | Disposition: A | Discharge status: Home / Self Care | Payer: Medicare Other | Attending: Emergency Medicine | Admitting: Emergency Medicine

## 2017-04-24 ENCOUNTER — Encounter (HOSPITAL_COMMUNITY): Payer: Self-pay | Admitting: *Deleted

## 2017-04-24 ENCOUNTER — Emergency Department (HOSPITAL_COMMUNITY): Payer: Medicare Other

## 2017-04-24 DIAGNOSIS — J45909 Unspecified asthma, uncomplicated: Secondary | ICD-10-CM | POA: Insufficient documentation

## 2017-04-24 DIAGNOSIS — R079 Chest pain, unspecified: Secondary | ICD-10-CM | POA: Insufficient documentation

## 2017-04-24 DIAGNOSIS — F17200 Nicotine dependence, unspecified, uncomplicated: Secondary | ICD-10-CM | POA: Diagnosis not present

## 2017-04-24 LAB — CBC WITH DIFFERENTIAL/PLATELET
BASOS PCT: 0 %
Basophils Absolute: 0 10*3/uL (ref 0.0–0.1)
EOS PCT: 3 %
Eosinophils Absolute: 0.2 10*3/uL (ref 0.0–0.7)
HEMATOCRIT: 42.5 % (ref 39.0–52.0)
Hemoglobin: 14.5 g/dL (ref 13.0–17.0)
Lymphocytes Relative: 40 %
Lymphs Abs: 2.4 10*3/uL (ref 0.7–4.0)
MCH: 30.2 pg (ref 26.0–34.0)
MCHC: 34.1 g/dL (ref 30.0–36.0)
MCV: 88.5 fL (ref 78.0–100.0)
MONO ABS: 0.3 10*3/uL (ref 0.1–1.0)
MONOS PCT: 5 %
NEUTROS ABS: 3 10*3/uL (ref 1.7–7.7)
Neutrophils Relative %: 52 %
PLATELETS: 216 10*3/uL (ref 150–400)
RBC: 4.8 MIL/uL (ref 4.22–5.81)
RDW: 13.7 % (ref 11.5–15.5)
WBC: 5.8 10*3/uL (ref 4.0–10.5)

## 2017-04-24 LAB — I-STAT CHEM 8, ED
BUN: 14 mg/dL (ref 6–20)
CREATININE: 1 mg/dL (ref 0.61–1.24)
Calcium, Ion: 1.14 mmol/L — ABNORMAL LOW (ref 1.15–1.40)
Chloride: 107 mmol/L (ref 101–111)
GLUCOSE: 105 mg/dL — AB (ref 65–99)
HEMATOCRIT: 42 % (ref 39.0–52.0)
Hemoglobin: 14.3 g/dL (ref 13.0–17.0)
Potassium: 4 mmol/L (ref 3.5–5.1)
Sodium: 143 mmol/L (ref 135–145)
TCO2: 25 mmol/L (ref 0–100)

## 2017-04-24 LAB — I-STAT TROPONIN, ED: Troponin i, poc: 0 ng/mL (ref 0.00–0.08)

## 2017-04-24 NOTE — ED Provider Notes (Signed)
[facility] Provider Note   CSN: [id number] Arrival date & time: 04/24/17  4098     History   Chief Complaint Chief Complaint  Patient presents with  . Chest Pain    HPI William Hale is a 56 y.o. male.  HPI Patient with left-sided chest pain. Upper chest. He works at [employer] began to have chest pain today. No fevers. He has been coughing with minimal sputum production. Does have a history asthma. No swelling in his legs. Slight nausea. No diaphoresis. States he did feel little lightheaded with it. No cardiac history. He is a smoker.   Past Medical History:  Diagnosis Date  . Asthma   . Blindness of both eyes     There are no active problems to display for this patient.   History reviewed. No pertinent surgical history.     Home Medications    Prior to Admission medications   Medication Sig Start Date End Date Taking? Authorizing Provider  oxyCODONE-acetaminophen (PERCOCET/ROXICET) 5-325 MG per tablet Take 1 tablet by mouth every 6 (six) hours as needed for severe pain. Patient not taking: Reported on 04/24/2017 08/28/15   Tilden Fossa, MD  permethrin (ELIMITE) 5 % cream Use as instructed on bottle, please instruct as pt is blind. Patient not taking: Reported on 04/24/2017 10/06/13   Linna Hoff, MD  silver sulfADIAZINE (SILVADENE) 1 % cream Apply 1 application topically 2 (two) times daily. Patient not taking: Reported on 04/24/2017 08/28/15   Tilden Fossa, MD    Family History History reviewed. No pertinent family history.  Social History Social History  Substance Use Topics  . Smoking status: Current Every Day Smoker    Packs/day: 0.50  . Smokeless tobacco: Never Used  . Alcohol use Yes     Comment: occasionally     Allergies   Patient has no known allergies.   Review of Systems Review of Systems  Constitutional: Negative for appetite change.  Respiratory: Positive for cough.   Cardiovascular: Positive for chest  pain.  Gastrointestinal: Positive for nausea. Negative for abdominal pain.  Genitourinary: Negative for dysuria.  Musculoskeletal: Negative for back pain.  Neurological: Negative for numbness.  Hematological: Negative for adenopathy.     Physical Exam Updated Vital Signs BP 112/75   Pulse (!) 56   Temp 97.8 F (36.6 C) (Oral)   Resp 18   SpO2 100%   Physical Exam  Constitutional: He appears well-developed.  HENT:  Head: Atraumatic.  Eyes:  Patient has sunglasses on.  Neck: Neck supple.  Cardiovascular: Normal rate.   Pulmonary/Chest: He exhibits no tenderness.  Abdominal: Soft.  Musculoskeletal: He exhibits no edema.  Neurological: He is alert.  Skin: Skin is warm. Capillary refill takes less than 2 seconds.  Psychiatric: He has a normal mood and affect.     ED Treatments / Results  Labs (all labs ordered are listed, but only abnormal results are displayed) Labs Reviewed  I-STAT CHEM 8, ED - Abnormal; Notable for the following:       Result Value   Glucose, Bld 105 (*)    Calcium, Ion 1.14 (*)    All other components within normal limits  CBC WITH DIFFERENTIAL/PLATELET  Rosezena Sensor, ED    EKG  EKG Interpretation  Date/Time:  Wednesday Apr 24 2017 10:02:51 EDT Ventricular Rate:  62 PR Interval:    QRS Duration: 100 QT Interval:  396 QTC Calculation: 403 R Axis:   102 Text Interpretation:  Sinus  rhythm Low voltage, precordial leads Consider right ventricular hypertrophy Confirmed by Rubin PayorPICKERING  MD, Harrold DonathNATHAN [phone] on 04/24/2017 10:10:33 AM       Radiology Dg Chest 2 View  Result Date: 04/24/2017 CLINICAL DATA:  Left side chest pain, cough, shortness of Breath EXAM: CHEST  2 VIEW COMPARISON:  08/08/2010 FINDINGS: Heart and mediastinal contours are within normal limits. No focal opacities or effusions. No acute bony abnormality. IMPRESSION: No active cardiopulmonary disease. Electronically Signed   By: Charlett NoseKevin  Dover M.D.   On: 04/24/2017 10:49     Procedures Procedures (including critical care time)  Medications Ordered in ED Medications - No data to display   Initial Impression / Assessment and Plan / ED Course  I have reviewed the triage vital signs and the nursing notes.  Pertinent labs & imaging results that were available during my care of the patient were reviewed by me and considered in my medical decision making (see chart for details).     Patient with left-sided chest pain. Trouble chest. Worse with palpation and movement. Unlikely pulmonary wasn't. Does not appear to be cardiac. He is however smoker. Will discharge home follow-up with primary care doctor.  Final Clinical Impressions(s) / ED Diagnoses   Final diagnoses:  Nonspecific chest pain    New Prescriptions New Prescriptions   No medications on file     Benjiman CorePickering, Edia Pursifull, MD 04/24/17 1257

## 2017-04-24 NOTE — Discharge Instructions (Signed)
Follow up with primary care doctor.

## 2017-04-24 NOTE — ED Triage Notes (Signed)
[facility] Pt states he began having left sided CP while at work that worsens with deep inspiration, palpation and motion. Pt has no cardiac hx and is a [employer] smoker.

## 2017-04-24 NOTE — ED Notes (Signed)
Pt is in stable condition upon d/c and is escorted from ED via wheelchair. 

## 2017-04-24 NOTE — ED Notes (Signed)
Patient transported to X-ray 

## 2017-04-24 NOTE — ED Notes (Signed)
Got patient undress on the monitor did ekg shown to Dr Rubin Payorpickering

## 2017-08-14 ENCOUNTER — Ambulatory Visit (INDEPENDENT_AMBULATORY_CARE_PROVIDER_SITE_OTHER): Payer: Medicare Other

## 2017-08-14 ENCOUNTER — Ambulatory Visit (HOSPITAL_COMMUNITY)
Admission: EM | Admit: 2017-08-14 | Discharge: 2017-08-14 | Disposition: A | Discharge status: Home / Self Care | Payer: Medicare Other | Attending: Family Medicine | Admitting: Family Medicine

## 2017-08-14 ENCOUNTER — Encounter (HOSPITAL_COMMUNITY): Payer: Self-pay | Admitting: Nurse Practitioner

## 2017-08-14 DIAGNOSIS — S4992XA Unspecified injury of left shoulder and upper arm, initial encounter: Secondary | ICD-10-CM

## 2017-08-14 NOTE — ED Triage Notes (Signed)
Pt presents with c/o left shoulder pain. The pain began several weeks ago after he fell onto the shoulder. The pain has been constant since and is not improving. He was also diagnosed with a staph infection at dermatology office yesterday, started on doxycyline and triamcinolone, but he forgot to ask a note to return to work. His job will not let him return until he is medically cleared

## 2017-08-14 NOTE — ED Provider Notes (Signed)
  [facility]   [id number] 08/14/17 Arrival Time: 1012  ASSESSMENT & PLAN:  1. Injury of left shoulder, initial encounter    Possible healing nondisplaced fracture of the acromion per radiology read. Placed in sling for comfort. OTC ibuprofen with food as needed. Will schedule appointment for orthopaedic follow up. Information given. May return here as needed. Work note given.  Reviewed expectations re: course of current medical issues. Questions answered. Outlined signs and symptoms indicating need for more acute intervention. Patient verbalized understanding. After Visit Summary given.   SUBJECTIVE:  William Hale is a 56 y.o. male who presents with complaint of pain in the left shoulder after a fall 3 weeks ago. Onset of symptoms was gradual starting 2 weeks ago. There is is not a history of a previous shoulder injury. Patient describes pain as aching. The pain does not radiate. Pain is aggravated by movement. Pain is alleviated by rest. No extremity numbness or weakness. The patient denies other injuries. Care prior to arrival consisted of acetaminophen and ice, with minimal relief. No previous injury to LUE reported.  ROS: As per HPI.   OBJECTIVE:  Vitals:   08/14/17 1121  BP: 122/80  Pulse: 74  Resp: 16  Temp: 98.8 F (37.1 C)  TempSrc: Oral  SpO2: 97%    General appearance: alert; no distress Neck: supple Extremities: reduced range of motion of L shoulder secondary to discomfort, humeral head tenderness; no obvious swelling; no cyanosis or edema of LUE; LUE with normal sensation Skin: warm and dry Psychological: alert and cooperative; normal mood and affect  Imaging: Dg Shoulder Left  Result Date: 08/14/2017 CLINICAL DATA:  Fall 3 weeks ago, left shoulder pain, initial encounter. EXAM: LEFT SHOULDER - 2+ VIEW COMPARISON:  None. FINDINGS: There may be a healing nondisplaced fracture of the acromion. Acromioclavicular joint is intact. Degenerative  changes are seen in the acromioclavicular joint. Glenohumeral joint is intact. Visualized portion of the left chest is unremarkable. IMPRESSION: 1. Possible healing nondisplaced fracture of the acromion. 2. Left acromioclavicular joint osteoarthritis. Electronically Signed   By: Leanna BattlesMelinda  Blietz M.D.   On: 08/14/2017 12:16    No Known Allergies  Past Medical History:  Diagnosis Date  . Asthma   . Blindness of both eyes     History reviewed. No pertinent surgical history.   Mardella LaymanHagler, Nattie Lazenby, MD 08/15/17 [phone]

## 2017-08-14 NOTE — Discharge Instructions (Signed)
Call for an orthopaedic follow up appointment. The radiologist thinks you may have a healing nondisplaced fracture of the acromion, a bone located near your left shoulder. You may wear the shoulder sling for comfort. Continue taking ibuprofen 600-800 mg every 8 hours with food.

## 2017-09-13 ENCOUNTER — Ambulatory Visit (INDEPENDENT_AMBULATORY_CARE_PROVIDER_SITE_OTHER): Payer: Medicare Other | Admitting: Family Medicine

## 2017-09-13 ENCOUNTER — Encounter: Payer: Self-pay | Admitting: Family Medicine

## 2017-09-13 ENCOUNTER — Telehealth: Payer: Self-pay | Admitting: Family Medicine

## 2017-09-13 VITALS — BP 94/64 | HR 57 | Temp 98.3°F | Ht 66.0 in | Wt 214.0 lb

## 2017-09-13 DIAGNOSIS — L089 Local infection of the skin and subcutaneous tissue, unspecified: Secondary | ICD-10-CM | POA: Diagnosis not present

## 2017-09-13 DIAGNOSIS — Z6834 Body mass index (BMI) 34.0-34.9, adult: Secondary | ICD-10-CM

## 2017-09-13 DIAGNOSIS — S42125D Nondisplaced fracture of acromial process, left shoulder, subsequent encounter for fracture with routine healing: Secondary | ICD-10-CM | POA: Diagnosis not present

## 2017-09-13 DIAGNOSIS — Z1159 Encounter for screening for other viral diseases: Secondary | ICD-10-CM

## 2017-09-13 DIAGNOSIS — E669 Obesity, unspecified: Secondary | ICD-10-CM

## 2017-09-13 DIAGNOSIS — Z114 Encounter for screening for human immunodeficiency virus [HIV]: Secondary | ICD-10-CM

## 2017-09-13 DIAGNOSIS — Z23 Encounter for immunization: Secondary | ICD-10-CM

## 2017-09-13 DIAGNOSIS — H543 Unqualified visual loss, both eyes: Secondary | ICD-10-CM

## 2017-09-13 DIAGNOSIS — B958 Unspecified staphylococcus as the cause of diseases classified elsewhere: Secondary | ICD-10-CM | POA: Diagnosis not present

## 2017-09-13 NOTE — Telephone Encounter (Signed)
SCAT and short term disability forms completed and placed up front. Can patient please be called to pick up the forms.

## 2017-09-13 NOTE — Patient Instructions (Addendum)
It was good to see you today!  For your skin infection, - We will get records from your dermatologist, please call them and make an follow up appointment  For your shoulder, - I have placed a referral to orthopedic surgery to help you get to your appointment on October 1  Thank you for getting your flu shot today  Please check-out at the front desk before leaving the clinic. Make an appointment in  1 week to follow up on skin.  We are checking some labs today. If results require attention, either myself or my nurse will get in touch with you. If everything is normal, you will get a letter in the mail or a message in My Chart. Please give [place] a call if you do not hear from [place] after 2 weeks.  Please bring all of your medications with you to each visit.   Sign up for My Chart to have easy access to your labs results, and communication with your primary care physician.  Feel free to call with any questions or concerns at any time, at [phone].   Take care,  Dr. Leland Her, DO [facility]

## 2017-09-13 NOTE — Assessment & Plan Note (Signed)
No evidence of acute infection of arms today given no erythema or other signs of cellulitis. Does had scabs in multiple stages of healing on his arms which may be the reason for patient's concerns. Attempted to reassure patient but he preferred to follow up with dermatology to see if he needs additional antibiotics. Will request records from [facility].

## 2017-09-13 NOTE — Assessment & Plan Note (Addendum)
Seen in the ED on 08/14/17 after fall and was found to have Possible healing nondisplaced fracture of the acromion with L acromioclavicular joint osteoarthritis on xray at that time. He was instructed to follow up Orthopedics Dr Eulah Pont. Referral placed today. Short-term disability paperwork was completed today as patient has not yet had a chance to establish with orthopedics, form was placed up at front desk.
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
## 2017-09-13 NOTE — Assessment & Plan Note (Addendum)
Patient's SCAT form was completed and placed up at the front desk for pickup

## 2017-09-14 LAB — LIPID PANEL
CHOLESTEROL TOTAL: 230 mg/dL — AB (ref 100–199)
Chol/HDL Ratio: 8.2 ratio — ABNORMAL HIGH (ref 0.0–5.0)
HDL: 28 mg/dL — AB (ref >39)
Triglycerides: 410 mg/dL — ABNORMAL HIGH (ref 0–149)

## 2017-09-14 LAB — HEPATITIS C ANTIBODY: Hep C Virus Ab: <0.1 s/co ratio (ref 0.0–0.9)

## 2017-09-14 LAB — HIV ANTIBODY (ROUTINE TESTING W REFLEX): HIV Screen 4th Generation wRfx: NONREACTIVE

## 2017-09-16 ENCOUNTER — Encounter: Payer: Self-pay | Admitting: Family Medicine

## 2017-09-16 NOTE — Telephone Encounter (Signed)
Left message to discussed cholesterol results and for patient to call back. Patient has appointment with me on 09/26/17 so can also discuss at that time.

## 2017-09-16 NOTE — Telephone Encounter (Signed)
Forms placed in fax pile

## 2017-09-16 NOTE — Telephone Encounter (Signed)
Patient returned call from PCP. Blood work reviewed. Patient aware that PCP will discuss these results more fully at upcoming OV. Patient requesting SCAT and disability forms to be faxed to numbers he provided. Kinnie Feil, RN, BSN

## 2017-09-26 ENCOUNTER — Ambulatory Visit (INDEPENDENT_AMBULATORY_CARE_PROVIDER_SITE_OTHER): Payer: Medicare Other | Admitting: Family Medicine

## 2017-09-26 ENCOUNTER — Encounter: Payer: Self-pay | Admitting: Family Medicine

## 2017-09-26 VITALS — BP 112/68 | HR 76 | Temp 98.3°F | Ht 66.0 in | Wt 213.0 lb

## 2017-09-26 DIAGNOSIS — R21 Rash and other nonspecific skin eruption: Secondary | ICD-10-CM | POA: Diagnosis not present

## 2017-09-26 DIAGNOSIS — Z1211 Encounter for screening for malignant neoplasm of colon: Secondary | ICD-10-CM

## 2017-09-26 DIAGNOSIS — Z23 Encounter for immunization: Secondary | ICD-10-CM | POA: Diagnosis not present

## 2017-09-26 DIAGNOSIS — E785 Hyperlipidemia, unspecified: Secondary | ICD-10-CM | POA: Diagnosis not present

## 2017-09-26 MED ORDER — TRIAMCINOLONE ACETONIDE 0.1 % EX CREA: 1.0000 "application " | TOPICAL_CREAM | Freq: Two times a day (BID) | CUTANEOUS | 0 refills | Status: DC

## 2017-09-26 MED ORDER — TETANUS-DIPHTH-ACELL PERTUSSIS 5-2.5-18.5 LF-MCG/0.5 IM SUSP: 0.5000 mL | Freq: Once | INTRAMUSCULAR | 0 refills | Status: AC

## 2017-09-26 MED ORDER — ATORVASTATIN CALCIUM 40 MG PO TABS: 40.0000 mg | ORAL_TABLET | Freq: Every day | ORAL | 3 refills | Status: DC

## 2017-09-26 NOTE — Assessment & Plan Note (Addendum)
Discussed lipid panel results with patient at length today that due to high triglycerides that LDL was not able to be calculated and therefore could not calculate patient's 88yr ASCVD risk. However since total cholesterol elevated and HDL low that suspect that he would likely benefit from statin. Offered starting medication vs recheck with direct LDL, patient preferred to start medication at this time.  - start atorvastatin  qd - advised healthy diet and of moderate intensity exercise daily - follow up in 6 months

## 2017-09-26 NOTE — Patient Instructions (Signed)
It was good to see you today!  For your cholesterol, - we talked about making some healthy lifestyle changes, maybe listening to some music while working out will help. - we will start a medication called atorvastatin, please take 1 pill a day  For your skin - I have refilled your cream so that you can make it to your derm appointment on 10/19  For you health maintenance - Tetanus booster today - Someone will call you about scheduling for colonoscopy, please let us know if you do not hear back after 2 weeks.    Please check-out at the front desk before leaving the clinic. Make an appointment in  6 months to follow up on your cholesterol.   Sign up for My Chart to have easy access to your labs results, and communication with your primary care physician.  Feel free to call with any questions or concerns at any time, at [phone].   Take care,  Dr. Leland Her, DO [facility]

## 2017-09-26 NOTE — Progress Notes (Signed)
    Subjective:  William Hale is a 56 y.o. male who presents to the [facility] today for follow up on lab results.  HPI:  Hyperlipidemia - States that tries to eat a low fat diety but does not track what he eats regularly - Does not exercise regularly because he states he stays active at work - no CP, SOB, muscle aches, abd pain - would like to start medication now but very hesitant to stay on indefinitely  Chronic skin rash - Has brought in his home kenalog cream which he has run out of and would like a refill until he can see his dermatologist on 10/04/17 - Has been using OTC hydrocortisone cream since running out of kenalog - feels like his rash is improving which he still refers to his "staph infection"  Health maintenance - cannot recall last tetanus booster - amenable to getting colonoscopy   ROS: Per HPI  Objective:  Physical Exam: BP 112/68   Pulse 76   Temp 98.3 F (36.8 C) (Oral)   Ht  (1.676 m)   Wt 213 lb (96.6 kg)   SpO2 97%   BMI 34.38 kg/m   Gen: NAD, resting comfortably. Sunglasses in place. CV: RRR with no murmurs appreciated Pulm: NWOB, CTAB with no crackles, wheezes, or rhonchi GI: Normal bowel sounds present. Soft, Nontender, Nondistended. MSK: no edema, cyanosis, or clubbing noted. R arm in sling. Skin: warm, dry. Healing scabs and healed papules diffusely over b/l UE improved from previous visit without erythema or pustules. Neuro: grossly normal, moves all extremities Psych: Normal affect and thought content   Assessment/Plan:  Hyperlipidemia Discussed lipid panel results with patient at length today that due to high triglycerides that LDL was not able to be calculated and therefore could not calculate patient's 52yr ASCVD risk. However since total cholesterol elevated and HDL low that suspect that he would likely benefit from statin. Offered starting medication vs recheck with direct LDL, patient preferred to start medication at this time.  -  start atorvastatin  qd - advised healthy diet and of moderate intensity exercise daily - follow up in 6 months  Rash and nonspecific skin eruption Records were requested from dermatology at last patient visit, still awaiting documentation regarding rash diagnosis. No signs of infection on arms today and again appears to be well healing. Will refill kenalog today to allow patient to make it to derm appt on 10/19.  Health maintenance - tetanus booster sent to pharmacy - referral to GI placed for colonoscopy  Leland Her, DO PGY-2, [facility] 09/26/2017 8:37 AM

## 2017-09-26 NOTE — Assessment & Plan Note (Addendum)
Records were requested from dermatology at last patient visit, still awaiting documentation regarding rash diagnosis. No signs of infection on arms today and again appears to be well healing. Will refill kenalog today to allow patient to make it to derm appt on 10/19.

## 2017-10-21 ENCOUNTER — Encounter: Payer: Self-pay | Admitting: Physical Therapy

## 2017-10-21 ENCOUNTER — Ambulatory Visit: Payer: Medicare Other | Attending: Orthopedic Surgery

## 2017-10-21 DIAGNOSIS — M25512 Pain in left shoulder: Secondary | ICD-10-CM | POA: Diagnosis not present

## 2017-10-21 DIAGNOSIS — R252 Cramp and spasm: Secondary | ICD-10-CM | POA: Insufficient documentation

## 2017-10-21 NOTE — Patient Instructions (Signed)
Management of tape to remove if irritting and leave on 3 days if helpful and cane get wet

## 2017-10-21 NOTE — Therapy (Signed)
[facility] [address] [place], [place], [address] Phone: [phone]   Fax:  [phone]  Physical Therapy Evaluation  Patient Details  Name: William Hale MRN: [id number] Date of Birth: 02-19-1961 Referring Provider: Margarita Rana, MD    Encounter Date: 10/21/2017  PT End of Session - 10/21/17 0723    Visit Number  1    Number of Visits  12    Date for PT Re-Evaluation  11/29/17    Authorization Type  [employer] MCR    PT Start Time  0715    PT Stop Time  0750    PT Time Calculation (min)  35 min    Activity Tolerance  Patient tolerated treatment well;Patient limited by pain    Behavior During Therapy  [facility] for tasks assessed/performed       Past Medical History:  Diagnosis Date  . Asthma    childhood  . Blindness of both eyes 2011   after sick with high fever, went blind after    Past Surgical History:  Procedure Laterality Date  . CYST EXCISION Right age 24   R elbow cyst removal    There were no vitals filed for this visit.   Subjective Assessment - 10/21/17 0718    Subjective  He reprts fall in August 2018 with non displaced acromium Lt.    He was put in sling for 3 weeks. 2 weeks ago injection has pain with lifting overhead.     Patient is accompained by:  Family member    Pertinent History  PT is blind.     Limitations  Lifting reaching ovehead   reaching ovehead   Diagnostic tests  Xray    Patient Stated Goals  Decreased pain    Currently in Pain?  Yes    Pain Score  2  6/10 with movement   6/10 with movement   Pain Location  Shoulder    Pain Orientation  Left;Lateral    Pain Descriptors / Indicators  Throbbing;Shooting    Pain Type  Chronic pain    Pain Radiating Towards  to elbow    Pain Onset  More than a month ago    Pain Frequency  Constant    Aggravating Factors   moving arm    Pain Relieving Factors  rest    Multiple Pain Sites  No         [facility] PT Assessment - 10/21/17 0001       Assessment   Medical Diagnosis  LT shoulder pain    Referring Provider  Margarita Rana, MD     Onset Date/Surgical Date  -- early 07/2017   early 07/2017   Hand Dominance  Right    Next MD Visit  11/25/17    Prior Therapy  no      Precautions   Precautions  None      Restrictions   Weight Bearing Restrictions  No      Balance Screen   Has the patient fallen in the past 6 months  Yes    How many times?  1    Has the patient had a decrease in activity level because of a fear of falling?   Yes    Is the patient reluctant to leave their home because of a fear of falling?   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       Cognition   Overall Cognitive Status  Within Functional Limits for tasks assessed      ROM / Strength   AROM / PROM / Strength  AROM;PROM;Strength      AROM   AROM Assessment Site  Shoulder    Right/Left Shoulder  Right;Left    Right Shoulder Flexion  145 Degrees    Right Shoulder ABduction  148 Degrees    Right Shoulder Internal Rotation  45 Degrees    Right Shoulder External Rotation  98 Degrees    Left Shoulder Flexion  145 Degrees    Left Shoulder ABduction  150 Degrees    Left Shoulder Internal Rotation  50 Degrees    Left Shoulder External Rotation  98 Degrees      PROM   PROM Assessment Site  Shoulder    Right/Left Shoulder  Left      Strength   Overall Strength Comments  Some give until urged to resiste then strenght normal.       Palpation   Palpation comment  Tender subacromial tissue and anterior soft tissue             Objective measurements completed on examination: See above findings.              PT Education - 10/21/17 0754    Education provided  Yes    Education Details  POC    Person(s) Educated  Patient    Methods  Explanation;Verbal cues    Comprehension  Verbalized understanding        PT Short Term Goals - 10/21/17 0750      PT SHORT TERM GOAL #1   Title  He will be indepnendnet with initial HEP    Time  3    Period  Weeks    Status  New      PT SHORT TERM GOAL #2   Title  He will report pai ndecr  40% or more with moving Lt arm    Time  3    Period  Weeks        PT Long Term Goals - 10/21/17 0751      PT LONG TERM GOAL #1   Title  He will be independent with all HEp issued    Time  6    Period  Weeks    Status  New      PT LONG TERM GOAL #2   Title  He will report no pain at rest AND 75% decreased pain with dressing and self care    Time  6    Period  Weeks    Status  New      PT LONG TERM GOAL #3   Title  He will be able to lift 10-15 pounds  with Lt arm with 1-2 max pain    Time  6    Period  Weeks    Status  New      PT LONG TERM GOAL #4   Title  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 His strnegth and ROm are WNL  but pain ful.    Clinical Presentation  Stable    Clinical Decision Making  Low    Rehab Potential  Good    PT Frequency  2x / week    PT Duration  6 weeks    PT Treatment/Interventions  Vasopneumatic Device;Taping;Manual techniques;Dry needling;Patient/family education;Therapeutic exercise;Cryotherapy;Iontophoresis 4mg /ml Dexamethasone;Moist Heat;Electrical Stimulation    PT Next Visit Plan  Modalities, STW, stretching, isometrics, HEP, ? retape    Consulted and Agree with Plan of Care  Patient       Patient will benefit from skilled therapeutic intervention in order to improve the following deficits and impairments:  Pain, Increased muscle spasms, Decreased activity tolerance, Impaired UE functional use  Visit Diagnosis: Left shoulder pain, unspecified chronicity  Cramp and  spasm  G-Codes - 10/21/17 0757    Functional Assessment Tool Used (Outpatient Only)  clinical judgement    Functional Limitation  Carrying, moving and handling objects    Carrying, Moving and Handling Objects Current Status (Z6109(G8984)  At least 60 percent but less than 80 percent impaired, limited or restricted    Carrying, Moving and Handling Objects Goal Status (U0454(G8985)  At least 20 percent but less than 40 percent impaired, limited or restricted        Problem List Patient Active Problem List   Diagnosis Date Noted  . Hyperlipidemia 09/26/2017  . Rash and nonspecific skin eruption 09/26/2017  . Obesity 09/13/2017  . Nondisplaced fracture of acromial process, left shoulder, subsequent encounter for fracture with routine healing 09/13/2017  . Blindness of both eyes 12/17/2009    Caprice RedChasse, Sreenidhi Ganson M  PT 10/21/2017, 7:57 AM  [facility] [address] [place], [place], [address] Phone: [phone]   Fax:  [phone]  Name: William Hale MRN: [id number] Date of Birth: 1961-01-10

## 2017-10-28 ENCOUNTER — Encounter: Payer: Self-pay | Admitting: Family Medicine

## 2017-10-28 ENCOUNTER — Telehealth: Payer: Self-pay | Admitting: Family Medicine

## 2017-10-28 NOTE — Telephone Encounter (Signed)
Can patient please be called and informed of [facility] attempts to schedule for colonoscopy? He should call them directly:  [facility] [address] [place], [place]  [address] Phone:  [phone]   Fax:  [phone] October 28, 2017

## 2017-10-29 ENCOUNTER — Encounter: Payer: Self-pay | Admitting: Physical Therapy

## 2017-10-29 ENCOUNTER — Ambulatory Visit: Payer: Medicare Other | Admitting: Physical Therapy

## 2017-10-29 DIAGNOSIS — M25512 Pain in left shoulder: Secondary | ICD-10-CM | POA: Diagnosis not present

## 2017-10-29 DIAGNOSIS — R252 Cramp and spasm: Secondary | ICD-10-CM | POA: Diagnosis not present

## 2017-10-29 NOTE — Therapy (Signed)
[facility] [address] [place], [place], [address] Phone: [phone]   Fax:  [phone]  Physical Therapy Treatment  Patient Details  Name: William Hale MRN: [id number] Date of Birth: 27-Aug-1961 Referring Provider: Margarita RanaImothy Murphy, MD    Encounter Date: 10/29/2017  PT End of Session - 10/29/17 1147    Visit Number  2    Number of Visits  12    Date for PT Re-Evaluation  11/29/17    PT Start Time  [phone] charge will not equal time slot,  Patient arrived early and I had a cancel,  he had 15 min moist heat,    PT Stop Time  0945    PT Time Calculation (min)  50 min    Activity Tolerance  Patient tolerated treatment well    Behavior During Therapy  [facility] for tasks assessed/performed       Past Medical History:  Diagnosis Date  . Asthma    childhood  . Blindness of both eyes 2011   after sick with high fever, went blind after    Past Surgical History:  Procedure Laterality Date  . CYST EXCISION Right age 89   R elbow cyst removal    There were no vitals filed for this visit.  Subjective Assessment - 10/29/17 0912    Subjective  Tape came off On sunday.  Wears sling 3 days a week.  Has a skin infection is being treated with medication.    Currently in Pain?  Yes    Pain Score  8     Pain Location  Shoulder    Pain Descriptors / Indicators  Throbbing;Shooting pops    Pain Radiating Towards  to elbow    Pain Frequency  Constant    Aggravating Factors   reaching moving    Pain Relieving Factors  rest    Effect of Pain on Daily Activities  limits ADL,  hair care.    Multiple Pain Sites  No                      [facility] Adult PT Treatment/Exercise - 10/29/17 0001      Self-Care   Self-Care  -- use of heat/ice for pain      Exercises   Exercises  Shoulder      Shoulder Exercises: Supine   Flexion  10 reps 2 sets AA with UE ranger    Other Supine Exercises  scapular retraction 10 X      Shoulder  Exercises: Seated   Retraction  10 reps    External Rotation  10 reps;AAROM Pain    Internal Rotation  10 reps;AAROM pain    Flexion  10 reps;AAROM    Flexion Limitations  pain      Shoulder Exercises: Isometric Strengthening   Flexion  5X5"    Extension  5X5"    External Rotation  5X5"    Internal Rotation  5X5"    ABduction  5X5"      Modalities   Modalities  Moist Heat      Moist Heat Therapy   Number Minutes Moist Heat  15 Minutes    Moist Heat Location  Shoulder      Manual Therapy   Manual therapy comments  gentle PROM,  soft tissuework,  teres tender             PT Education - 10/29/17 1153    Education provided  Yes  Education Details  use of heat/ice for pain control    Person(s) Educated  Patient    Methods  Explanation    Comprehension  Verbalized understanding       PT Short Term Goals - 10/21/17 0750      PT SHORT TERM GOAL #1   Title  He will be indepnendnet with initial HEP    Time  3    Period  Weeks    Status  New      PT SHORT TERM GOAL #2   Title  He will report pai ndecr  40% or more with moving Lt arm    Time  3    Period  Weeks        PT Long Term Goals - 10/21/17 0751      PT LONG TERM GOAL #1   Title  He will be independent with all HEp issued    Time  6    Period  Weeks    Status  New      PT LONG TERM GOAL #2   Title  He will report no pain at rest AND 75% decreased pain with dressing and self care    Time  6    Period  Weeks    Status  New      PT LONG TERM GOAL #3   Title  He will be able to lift 10-15 pounds  with Lt arm with 1-2 max pain    Time  6    Period  Weeks    Status  New      PT LONG TERM GOAL #4   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  Isometrics tolerated with cues.  AAROM painful.  Pain decreased with moist hest.  AAROM  shoulder not formally measured ,  estimated 110 with pain.   Did not tape due to skin infection.    PT Next Visit Plan  Modalities, STW, stretching, isometrics, HEP, ? retape,      Consulted and Agree with Plan of Care  Patient       Patient will benefit from skilled therapeutic intervention in order to improve the following deficits and impairments:     Visit Diagnosis: Left shoulder pain, unspecified chronicity  Cramp and spasm     Problem List Patient Active Problem List   Diagnosis Date Noted  . Hyperlipidemia 09/26/2017  . Rash and nonspecific skin eruption 09/26/2017  . Obesity 09/13/2017  . Nondisplaced fracture of acromial process, left shoulder, subsequent encounter for fracture with routine healing 09/13/2017  . Blindness of both eyes 12/17/2009    Paloma Grange PTA 10/29/2017, 11:55 AM  [facility] [address] [place], [place], [address] Phone: [phone]   Fax:  [phone]  Name: William Hale MRN: [id number] Date of Birth: 1961/03/12

## 2017-10-31 ENCOUNTER — Ambulatory Visit: Payer: Medicare Other

## 2017-10-31 DIAGNOSIS — R252 Cramp and spasm: Secondary | ICD-10-CM | POA: Diagnosis not present

## 2017-10-31 DIAGNOSIS — M25512 Pain in left shoulder: Secondary | ICD-10-CM | POA: Diagnosis not present

## 2017-10-31 NOTE — Therapy (Signed)
[facility] [address] [place], [place], [address] Phone: [phone]   Fax:  [phone]  Physical Therapy Treatment  Patient Details  Name: William Hale MRN: [id number] Date of Birth: 1961/10/23 Referring Provider: Margarita RanaImothy Murphy, MD    Encounter Date: 10/31/2017  PT End of Session - 10/31/17 1140    Visit Number  3    Number of Visits  12    Date for PT Re-Evaluation  11/29/17    Authorization Type  UHC MCR    PT Start Time  1142    PT Stop Time  1233    PT Time Calculation (min)  51 min    Activity Tolerance  Patient tolerated treatment well    Behavior During Therapy  [facility] for tasks assessed/performed       Past Medical History:  Diagnosis Date  . Asthma    childhood  . Blindness of both eyes 2011   after sick with high fever, went blind after    Past Surgical History:  Procedure Laterality Date  . CYST EXCISION Right age 89   R elbow cyst removal    There were no vitals filed for this visit.  Subjective Assessment - 10/31/17 1146    Subjective  Shoulder  not better  feels like punch in LT shoulder    Currently in Pain?  Yes    Pain Score  3     Pain Location  Shoulder    Pain Orientation  Left;Lateral    Pain Descriptors / Indicators  Shooting;Throbbing    Pain Type  Chronic pain    Pain Onset  More than a month ago    Pain Frequency  Constant                      [facility] Adult PT Treatment/Exercise - 10/31/17 0001      Modalities   Modalities  Ultrasound      Moist Heat Therapy   Number Minutes Moist Heat  12 Minutes    Moist Heat Location  Shoulder      Ultrasound   Ultrasound Location  Lt shoulder    Ultrasound Parameters  100% 1MHz 1.5Wcm2, 100%    Ultrasound Goals  Pain      Manual Therapy   Manual therapy comments  gentle PROM,  soft tissuework,  supraspinatus very tneder but less after STW with tool.  tender               PT Short Term Goals - 10/21/17 0750       PT SHORT TERM GOAL #1   Title  He will be indepnendnet with initial HEP    Time  3    Period  Weeks    Status  New      PT SHORT TERM GOAL #2   Title  He will report pai ndecr  40% or more with moving Lt arm    Time  3    Period  Weeks        PT Long Term Goals - 10/21/17 0751      PT LONG TERM GOAL #1   Title  He will be independent with all HEp issued    Time  6    Period  Weeks    Status  New      PT LONG TERM GOAL #2   Title  He will report no pain at rest AND 75% decreased pain with dressing and self  care    Time  6    Period  Weeks    Status  New      PT LONG TERM GOAL #3   Title  He will be able to lift 10-15 pounds  with Lt arm with 1-2 max pain    Time  6    Period  Weeks    Status  New      PT LONG TERM GOAL #4   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 retape,      Consulted and Agree with Plan of Care  Patient       Patient will benefit from skilled therapeutic intervention in order to improve the following deficits and impairments:  Pain, Increased muscle spasms, Decreased activity tolerance, Impaired UE functional use  Visit Diagnosis: Left shoulder pain, unspecified chronicity  Cramp and spasm     Problem List Patient Active Problem List   Diagnosis Date Noted  . Hyperlipidemia 09/26/2017  . Rash and nonspecific skin eruption 09/26/2017  . Obesity 09/13/2017  . Nondisplaced fracture of acromial process, left shoulder, subsequent encounter for fracture with  routine healing 09/13/2017  . Blindness of both eyes 12/17/2009    Caprice RedChasse, William Hale  PT 10/31/2017, 12:16 PM  [facility] [address] [place], [place], [address] Phone: [phone]   Fax:  [phone]  Name: William Hale MRN: [id number] Date of Birth: Jul 02, 1961

## 2017-11-04 ENCOUNTER — Ambulatory Visit: Payer: Medicare Other | Admitting: Physical Therapy

## 2017-11-04 DIAGNOSIS — M25512 Pain in left shoulder: Secondary | ICD-10-CM

## 2017-11-04 DIAGNOSIS — R252 Cramp and spasm: Secondary | ICD-10-CM | POA: Diagnosis not present

## 2017-11-04 NOTE — Therapy (Signed)
[facility] [address] [place], [place], [address] Phone: [phone]   Fax:  [phone]  Physical Therapy Treatment  Patient Details  Name: William Hale MRN: [id number] Date of Birth: 1961-07-20 Referring Provider: Margarita RanaImothy Murphy, MD    Encounter Date: 11/04/2017  PT End of Session - 11/04/17 0923    Visit Number  4    Number of Visits  12    Date for PT Re-Evaluation  11/29/17    PT Start Time  0926    PT Stop Time  1020    PT Time Calculation (min)  54 min    Activity Tolerance  Patient tolerated treatment well    Behavior During Therapy  [facility] for tasks assessed/performed       Past Medical History:  Diagnosis Date  . Asthma    childhood  . Blindness of both eyes 2011   after sick with high fever, went blind after    Past Surgical History:  Procedure Laterality Date  . CYST EXCISION Right age 89   R elbow cyst removal    There were no vitals filed for this visit.  Subjective Assessment - 11/04/17 0923    Subjective  "I think the shoulder is getting better" pt reports it feels like someone is digging their knuckle in.    Currently in Pain?  Yes    Pain Score  3     Pain Location  Shoulder    Pain Onset  More than a month ago    Pain Frequency  Constant    Aggravating Factors   quick movements    Pain Relieving Factors  moving it around                      [facility] Treatment/Exercise - 11/04/17 0959      Shoulder Exercises: Supine   Protraction  10 reps;Both x 2  with dowel rod    Other Supine Exercises  shoulder IR 1 x 12 with yellow theraband    Other Supine Exercises  retraction with ER using yellow theraband 2 x 10      Moist Heat Therapy   Number Minutes Moist Heat  10 Minutes    Moist Heat Location  Shoulder supine      Manual Therapy   Manual Therapy  Soft tissue mobilization    Soft tissue mobilization  IASTM over middle deltoid/ posterior deltoid.       Trigger  Point Dry Needling - 11/04/17 [id number]    Consent Given?  Yes    Education Handout Provided  Yes           PT Education - 11/04/17 1004    Education provided  Yes    Education Details  anatomy of muscle and referral patterns. benefits of TPDN, what it is, what to expect.    Person(s) Educated  Patient    Methods  Explanation;Verbal cues    Comprehension  Verbalized understanding       PT Short Term Goals - 10/21/17 0750      PT SHORT TERM GOAL #1   Title  He will be indepnendnet with initial HEP    Time  3    Period  Weeks    Status  New      PT SHORT TERM GOAL #2   Title  He will report pai ndecr  40% or more with moving Lt arm    Time  3  Period  Weeks        PT Long Term Goals - 10/21/17 0751      PT LONG TERM GOAL #1   Title  He will be independent with all HEp issued    Time  6    Period  Weeks    Status  New      PT LONG TERM GOAL #2   Title  He will report no pain at rest AND 75% decreased pain with dressing and self care    Time  6    Period  Weeks    Status  New      PT LONG TERM GOAL #3   Title  He will be able to lift 10-15 pounds  with Lt arm with 1-2 max pain    Time  6    Period  Weeks    Status  New      PT LONG TERM GOAL #4   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 DN was explained and performed on the middle /posterior deltoid, and l upper trap followed with IASTM techniques. performed gentle shoulder strengthening/ stability exercises which he required tactile cues for form.     PT Treatment/Interventions  Vasopneumatic Device;Taping;Manual techniques;Dry needling;Patient/family education;Therapeutic exercise;Cryotherapy;Iontophoresis 4mg /ml Dexamethasone;Moist Heat;Electrical Stimulation    PT Next Visit Plan  response to DNModalities, STW, stretching,  isometrics, HEP, ? retape,      Consulted and Agree with Plan of Care  Patient       Patient will benefit from skilled therapeutic intervention in order to improve the following deficits and impairments:  Pain, Increased muscle spasms, Decreased activity tolerance, Impaired UE functional use  Visit Diagnosis: Left shoulder pain, unspecified chronicity  Cramp and spasm     Problem List Patient Active Problem List   Diagnosis Date Noted  . Hyperlipidemia 09/26/2017  . Rash and nonspecific skin eruption 09/26/2017  . Obesity 09/13/2017  . Nondisplaced fracture of acromial process, left shoulder, subsequent encounter for fracture with routine healing 09/13/2017  . Blindness of both eyes 12/17/2009   [name] PT, DPT, LAT, ATC  11/04/17  10:18 AM      [facility] [address] [place], [place], [address] Phone: [phone]   Fax:  [phone]  Name: William Hale MRN: [id number] Date of Birth: [birth year]

## 2017-11-05 ENCOUNTER — Ambulatory Visit: Payer: Medicare Other

## 2017-11-05 ENCOUNTER — Telehealth: Payer: Self-pay | Admitting: Family Medicine

## 2017-11-05 DIAGNOSIS — R252 Cramp and spasm: Secondary | ICD-10-CM

## 2017-11-05 DIAGNOSIS — M25512 Pain in left shoulder: Secondary | ICD-10-CM | POA: Diagnosis not present

## 2017-11-05 NOTE — Therapy (Signed)
[facility] [address] [place], [place], [address] Phone: [phone]   Fax:  [phone]  Physical Therapy Treatment  Patient Details  Name: William Hale MRN: [id number] Date of Birth: 1961-12-07 Referring Provider: Margarita RanaImothy Murphy, MD    Encounter Date: 11/05/2017  PT End of Session - 11/05/17 0947    Visit Number  5    Number of Visits  12    Date for PT Re-Evaluation  11/29/17    Authorization Type  UHC MCR    PT Start Time  0945 late 14 min    PT Stop Time  1015    PT Time Calculation (min)  30 min    Activity Tolerance  Patient tolerated treatment well    Behavior During Therapy  [facility] for tasks assessed/performed       Past Medical History:  Diagnosis Date  . Asthma    childhood  . Blindness of both eyes 2011   after sick with high fever, went blind after    Past Surgical History:  Procedure Laterality Date  . CYST EXCISION Right age 89   R elbow cyst removal    There were no vitals filed for this visit.  Subjective Assessment - 11/05/17 0948    Subjective  Shoulder after DN treatment.  Felt better but now more soreness .    (Pended)     Currently in Pain?  Yes  (Pended)     Pain Score  3   (Pended)     Pain Location  Shoulder  (Pended)     Pain Orientation  Left;Lateral  (Pended)     Pain Descriptors / Indicators  Throbbing  (Pended)     Pain Type  Chronic pain  (Pended)     Pain Onset  More than a month ago  (Pended)     Pain Frequency  Constant  (Pended)                       [facility] Adult PT Treatment/Exercise - 11/05/17 0001      Ultrasound   Ultrasound Location  LT shoulder    Ultrasound Parameters  100% 1 Mhz, 1.5 Wcm2 ! Mhz    Ultrasound Goals  Pain      Manual Therapy   Manual therapy comments  gentle PROM,  soft tissuework,  supraspinatus very tneder but less after STW with tool.  tender    Soft tissue mobilization  IASTM over middle deltoid/ posterior deltoid.        Trigger Point Dry Needling - 11/04/17 [id number]    Consent Given?  Yes    Education Handout Provided  Yes           PT Education - 11/04/17 1004    Education provided  Yes    Education Details  anatomy of muscle and referral patterns. benefits of TPDN, what it is, what to expect.    Person(s) Educated  Patient    Methods  Explanation;Verbal cues    Comprehension  Verbalized understanding       PT Short Term Goals - 10/21/17 0750      PT SHORT TERM GOAL #1   Title  He will be indepnendnet with initial HEP    Time  3    Period  Weeks    Status  New      PT SHORT TERM GOAL #2   Title  He will report pai ndecr  40% or more with moving  Lt arm    Time  3    Period  Weeks        PT Long Term Goals - 10/21/17 0751      PT LONG TERM GOAL #1   Title  He will be independent with all HEp issued    Time  6    Period  Weeks    Status  New      PT LONG TERM GOAL #2   Title  He will report no pain at rest AND 75% decreased pain with dressing and self care    Time  6    Period  Weeks    Status  New      PT LONG TERM GOAL #3   Title  He will be able to lift 10-15 pounds  with Lt arm with 1-2 max pain    Time  6    Period  Weeks    Status  New      PT LONG TERM GOAL #4   Title  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 Marland Kitchen. Post session he reported shoulder felt good and looser.  Will continue x 3-4 weeks and progress strength and continue pain control.     PT Treatment/Interventions  Vasopneumatic Device;Taping;Manual techniques;Dry needling;Patient/family education;Therapeutic exercise;Cryotherapy;Iontophoresis 4mg /ml Dexamethasone;Moist Heat;Electrical Stimulation    PT Next Visit Plan  Continue STW , modalities , proggress ROM /strength, DN    Consulted and Agree with Plan of Care  Patient        Patient will benefit from skilled therapeutic intervention in order to improve the following deficits and impairments:  Pain, Increased muscle spasms, Decreased activity tolerance, Impaired UE functional use  Visit Diagnosis: Left shoulder pain, unspecified chronicity  Cramp and spasm     Problem List Patient Active Problem List   Diagnosis Date Noted  . Hyperlipidemia 09/26/2017  . Rash and nonspecific skin eruption 09/26/2017  . Obesity 09/13/2017  . Nondisplaced fracture of acromial process, left shoulder, subsequent encounter for fracture with routine healing 09/13/2017  . Blindness of both eyes 12/17/2009    Caprice RedChasse, Jatavia Keltner M   PT 11/05/2017, 10:18 AM  [facility] [address] [place], [place], [address] Phone: [phone]   Fax:  [phone]  Name: William Hale MRN: [id number] Date of Birth: 1961/11/30

## 2017-11-05 NOTE — Telephone Encounter (Signed)
Second request for disability form dropped off for at front desk for completion.  Verified that patient section of form has been completed.  Last DOS/WCC with PCP was 09/26/17.  Placed form in team folder to be completed by clinical staff.  William ManningLynette D Hale Patient has appointment on 11/22/17 as a back up plan if he needs to come in to see you.  Please call [phone] when forms are ready asap.

## 2017-11-05 NOTE — Telephone Encounter (Signed)
Clinical info completed on disability form.  Place form in Dr. Olegario MessierYoo's box for completion.  Feliz BeamHARTSELL,  Jaela Yepez, CMA

## 2017-11-06 NOTE — Telephone Encounter (Signed)
Left message on voicemail informing that forms were ready to be picked up. 

## 2017-11-06 NOTE — Telephone Encounter (Signed)
Form completed and placed in RN office. 

## 2017-11-11 ENCOUNTER — Ambulatory Visit: Payer: Medicare Other

## 2017-11-11 DIAGNOSIS — R252 Cramp and spasm: Secondary | ICD-10-CM | POA: Diagnosis not present

## 2017-11-11 DIAGNOSIS — M25512 Pain in left shoulder: Secondary | ICD-10-CM

## 2017-11-11 NOTE — Therapy (Signed)
[facility] [address] [place], [place], [address] Phone: [phone]   Fax:  [phone]  Physical Therapy Treatment  Patient Details  Name: William Hale MRN: [id number] Date of Birth: [birth year] Referring Provider: Margarita RanaImothy Murphy, MD    Encounter Date: 11/11/2017  PT End of Session - 11/11/17 0942    Visit Number  6    Number of Visits  12    Date for PT Re-Evaluation  11/29/17    Authorization Type  UHC MCR    PT Start Time  0934    PT Stop Time  1025    PT Time Calculation (min)  51 min    Activity Tolerance  Patient tolerated treatment well;Patient limited by pain    Behavior During Therapy  [facility] for tasks assessed/performed       Past Medical History:  Diagnosis Date  . Asthma    childhood  . Blindness of both eyes 2011   after sick with high fever, went blind after    Past Surgical History:  Procedure Laterality Date  . CYST EXCISION Right age 89   R elbow cyst removal    There were no vitals filed for this visit.  Subjective Assessment - 11/11/17 0940    Subjective  Lt shoulder getting there. Less pain with moving arm .    Currently in Pain?  Yes    Pain Score  3     Pain Location  Shoulder    Pain Orientation  Left;Lateral    Pain Descriptors / Indicators  Throbbing;Shooting    Pain Type  Chronic pain    Pain Onset  More than a month ago    Pain Frequency  Constant    Aggravating Factors   quick movements    Pain Relieving Factors  moving arm     Multiple Pain Sites  No                      [facility] Adult PT Treatment/Exercise - 11/11/17 0001      Shoulder Exercises: Seated   Other Seated Exercises  red band x 10 IR/ER /extension. Pain with all motions more with extension       Moist Heat Therapy   Number Minutes Moist Heat  10 Minutes    Moist Heat Location  Shoulder left      Ultrasound   Ultrasound Location  Lt shoulder    Ultrasound Parameters  100% !MHZ 1.5 Wcm2      Manual Therapy   Manual therapy comments  gentle PROM,  soft tissuework,  supraspinatus very tneder but less after STW with tool.  tender    Soft tissue mobilization  IASTM over middle deltoid/ posterior deltoid.       ALSO scapula retraction active assisted ROM all plans        PT Short Term Goals - 11/11/17 1109      PT SHORT TERM GOAL #1   Title  He will be indepnendnet with initial HEP    Status  On-going      PT SHORT TERM GOAL #2   Title  He will report pai ndecr  40% or more with moving Lt arm    Baseline  improved to 3/10 from 8 /10    Status  Achieved        PT Long Term Goals - 10/21/17 0751      PT LONG TERM GOAL #1   Title  He will be  independent with all HEp issued    Time  6    Period  Weeks    Status  New      PT LONG TERM GOAL #2   Title  He will report no pain at rest AND 75% decreased pain with dressing and self care    Time  6    Period  Weeks    Status  New      PT LONG TERM GOAL #3   Title  He will be able to lift 10-15 pounds  with Lt arm with 1-2 max pain    Time  6    Period  Weeks    Status  New      PT LONG TERM GOAL #4   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   Pain level 3/10 last 4 appointments. See MD in 2 weeks so will continue until then then let MD assess    PT Treatment/Interventions  Vasopneumatic Device;Taping;Manual techniques;Dry needling;Patient/family education;Therapeutic exercise;Cryotherapy;Iontophoresis 4mg /ml Dexamethasone;Moist Heat;Electrical Stimulation    PT Next Visit Plan  Continue STW , modalities , proggress ROM /strength, DN And active or isometric exercsies    Consulted and Agree with Plan of Care  Patient       Patient will benefit from skilled therapeutic intervention  in order to improve the following deficits and impairments:  Pain, Increased muscle spasms, Decreased activity tolerance, Impaired UE functional use  Visit Diagnosis: Left shoulder pain, unspecified chronicity  Cramp and spasm     Problem List Patient Active Problem List   Diagnosis Date Noted  . Hyperlipidemia 09/26/2017  . Rash and nonspecific skin eruption 09/26/2017  . Obesity 09/13/2017  . Nondisplaced fracture of acromial process, left shoulder, subsequent encounter for fracture with routine healing 09/13/2017  . Blindness of both eyes 12/17/2009    Caprice RedChasse, Girtie Wiersma M  PT 11/11/2017, 11:10 AM  [facility] [address] [place], [place], [address] Phone: [phone]   Fax:  [phone]  Name: William Hale MRN: [id number] Date of Birth: [birth year]

## 2017-11-13 NOTE — Telephone Encounter (Signed)
Pt requested forms to be faxed.  They were faxed to [employer].

## 2017-11-14 ENCOUNTER — Ambulatory Visit: Payer: Medicare Other

## 2017-11-18 ENCOUNTER — Ambulatory Visit: Payer: Medicare Other | Attending: Orthopedic Surgery

## 2017-11-18 DIAGNOSIS — M25512 Pain in left shoulder: Secondary | ICD-10-CM | POA: Diagnosis not present

## 2017-11-18 DIAGNOSIS — R252 Cramp and spasm: Secondary | ICD-10-CM | POA: Diagnosis not present

## 2017-11-18 NOTE — Patient Instructions (Signed)
Strengthening: Isometric Flexion  Using wall for resistance, press right fist into ball using light pressure. Hold ____ seconds. Repeat ____ times per set. Do ____ sets per session. Do ____ sessions per day.  SHOULDER: Abduction (Isometric)  Use wall as resistance. Press arm against pillow. Keep elbow straight. Hold ___ seconds. ___ reps per set, ___ sets per day, ___ days per week  Extension (Isometric)  Place left bent elbow and back of arm against wall. Press elbow against wall. Hold ____ seconds. Repeat ____ times. Do ____ sessions per day.  Internal Rotation (Isometric)  Place palm of right fist against door frame, with elbow bent. Press fist against door frame. Hold ____ seconds. Repeat ____ times. Do ____ sessions per day.  External Rotation (Isometric)  Place back of left fist against door frame, with elbow bent. Press fist against door frame. Hold ____ seconds. Repeat ____ times. Do ____ sessions per day.  Copyright  VHI. All rights reserved.   ALL EXERCISES 1-2X/DAY.  5-10 REPS 5-10 SEC   HOLD

## 2017-11-18 NOTE — Therapy (Signed)
[facility] [address] [place], [place], [address] Phone: [phone]   Fax:  [phone]  Physical Therapy Treatment  Patient Details  Name: William Hale MRN: [id number] Date of Birth: July 06, 1961 Referring Provider: Margarita RanaImothy Murphy, MD    Encounter Date: 11/18/2017  PT End of Session - 11/18/17 0830    Visit Number  7    Number of Visits  12    Date for PT Re-Evaluation  11/29/17    Authorization Type  UHC MCR    PT Start Time  0835    PT Stop Time  0925    PT Time Calculation (min)  50 min    Activity Tolerance  Patient tolerated treatment well;Patient limited by pain    Behavior During Therapy  [facility] for tasks assessed/performed       Past Medical History:  Diagnosis Date  . Asthma    childhood  . Blindness of both eyes 2011   after sick with high fever, went blind after    Past Surgical History:  Procedure Laterality Date  . CYST EXCISION Right age 89   R elbow cyst removal    There were no vitals filed for this visit.  Subjective Assessment - 11/18/17 0927    Subjective  LT shoulder feeling alot better . Pain less than last time .  Can move over head now. Still pain with lifting and reaching though with weight . Have been putting up Christmas light.s     Currently in Pain?  Yes    Pain Score  2     Pain Location  Shoulder    Pain Orientation  Left    Pain Descriptors / Indicators  Aching;Shooting    Pain Type  Chronic pain    Pain Onset  More than a month ago    Pain Frequency  Constant    Aggravating Factors   lifting and quick movements    Pain Relieving Factors  gentle movements .     Multiple Pain Sites  No         [facility] PT Assessment - 11/18/17 0001      AROM   Left Shoulder Flexion  148 Degrees    Left Shoulder ABduction  150 Degrees    Left Shoulder Internal Rotation  50 Degrees    Left Shoulder External Rotation  98 Degrees                  [facility] Adult PT Treatment/Exercise  - 11/18/17 0001      Shoulder Exercises: Isometric Strengthening   Flexion  5X5"    Extension  5X5"    External Rotation  5X5"    Internal Rotation  5X5"    ABduction  5X5"    ADduction  5X5"      Ultrasound   Ultrasound Location  LT shoulder    Ultrasound Parameters  100% 1Mhz 1.5 Wcm2    Ultrasound Goals  Pain             PT Education - 11/18/17 1009    Education provided  Yes    Education Details  isometrics     Person(s) Educated  Patient    Methods  Explanation;Demonstration;Tactile cues;Verbal cues;Handout    Comprehension  Verbalized understanding;Returned demonstration       PT Short Term Goals - 11/11/17 1109      PT SHORT TERM GOAL #1   Title  He will be indepnendnet with initial HEP  Status  On-going      PT SHORT TERM GOAL #2   Title  He will report pai ndecr  40% or more with moving Lt arm    Baseline  improved to 3/10 from 8 /10    Status  Achieved        PT Long Term Goals - 10/21/17 0751      PT LONG TERM GOAL #1   Title  He will be independent with all HEp issued    Time  6    Period  Weeks    Status  New      PT LONG TERM GOAL #2   Title  He will report no pain at rest AND 75% decreased pain with dressing and self care    Time  6    Period  Weeks    Status  New      PT LONG TERM GOAL #3   Title  He will be able to lift 10-15 pounds  with Lt arm with 1-2 max pain    Time  6    Period  Weeks    Status  New      PT LONG TERM GOAL #4   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 Tolerated isometrics  but needed much cuing due to lack of vision nad cued to decr pressure if pain escalates    PT Treatment/Interventions  Vasopneumatic Device;Taping;Manual techniques;Dry needling;Patient/family education;Therapeutic exercise;Cryotherapy;Iontophoresis 4mg /ml Dexamethasone;Moist  Heat;Electrical Stimulation    PT Next Visit Plan  Continue STW , modalities , proggress ROM /strength, DN And review isometrics  exercsies    PT Home Exercise Plan  isometrics    Consulted and Agree with Plan of Care  Patient       Patient will benefit from skilled therapeutic intervention in order to improve the following deficits and impairments:  Pain, Increased muscle spasms, Decreased activity tolerance, Impaired UE functional use  Visit Diagnosis: Cramp and spasm  Left shoulder pain, unspecified chronicity   G-Codes - 11/18/17 1011    Functional Assessment Tool Used (Outpatient Only)  clinical judgement    Functional Limitation  Carrying, moving and handling objects    Carrying, Moving and Handling Objects Current Status (Z6109(G8984)  At least 40 percent but less than 60 percent impaired, limited or restricted    Carrying, Moving and Handling Objects Goal Status (U0454(G8985)  At least 20 percent but less than 40 percent impaired, limited or restricted       Problem List Patient Active Problem List   Diagnosis Date Noted  . Hyperlipidemia 09/26/2017  . Rash and nonspecific skin eruption 09/26/2017  . Obesity 09/13/2017  . Nondisplaced fracture of acromial process, left shoulder, subsequent encounter for fracture with routine healing 09/13/2017  . Blindness of both eyes 12/17/2009    Caprice RedChasse, Pinki Rottman M  PT 11/18/2017, 10:12 AM  [facility] [address] [place], [place], [address] Phone: [phone]   Fax:  [phone]  Name: William Hale MRN: [id number] Date of Birth: 05-Jan-1961

## 2017-11-21 ENCOUNTER — Ambulatory Visit: Payer: Medicare Other | Admitting: Physical Therapy

## 2017-11-21 ENCOUNTER — Encounter: Payer: Self-pay | Admitting: Physical Therapy

## 2017-11-21 DIAGNOSIS — R252 Cramp and spasm: Secondary | ICD-10-CM

## 2017-11-21 DIAGNOSIS — M25512 Pain in left shoulder: Secondary | ICD-10-CM

## 2017-11-21 NOTE — Therapy (Signed)
[facility] [address] [place], [place], [address] Phone: [phone]   Fax:  [phone]  Physical Therapy Treatment  Patient Details  Name: William ArtistScott M Hale MRN: [id number] Date of Birth: Mar 12, 1961 Referring Provider: Margarita RanaImothy Murphy, MD    Encounter Date: 11/21/2017  PT End of Session - 11/21/17 [id number]    Visit Number  8    Number of Visits  12    Date for PT Re-Evaluation  11/29/17    PT Start Time  0930    PT Stop Time  1019    PT Time Calculation (min)  49 min    Activity Tolerance  Patient tolerated treatment well    Behavior During Therapy  [facility] for tasks assessed/performed       Past Medical History:  Diagnosis Date  . Asthma    childhood  . Blindness of both eyes 2011   after sick with high fever, went blind after    Past Surgical History:  Procedure Laterality Date  . CYST EXCISION Right age 89   R elbow cyst removal    There were no vitals filed for this visit.  Subjective Assessment - 11/21/17 0915    Subjective  "I am feeling like I am alittle sore in the shoulder from doing the exercise"     Currently in Pain?  Yes    Pain Score  3     Pain Orientation  Left    Pain Descriptors / Indicators  Aching;Sore    Pain Type  Chronic pain    Pain Onset  More than a month ago    Aggravating Factors   lifting and quick movements and external rotation                      [facility] Adult PT Treatment/Exercise - 11/21/17 0953      Shoulder Exercises: Isometric Strengthening   Flexion  5X10" in supine with manual     Extension  5X10" in supine with manual     External Rotation  5X10" in supine with manual     Internal Rotation  5X10" in supine with manual       Moist Heat Therapy   Number Minutes Moist Heat  10 Minutes    Moist Heat Location  Shoulder in supine      Manual Therapy   Manual Therapy  Joint mobilization;Myofascial release;Scapular mobilization    Joint Mobilization  T1-T7 PA  grade 4    Soft tissue mobilization  IASTM over levator scapulae, upper trap, Rhomboids    Myofascial Release  fascial rolling and stretching of the upper trap, levator and Rhomboids on the L    Scapular Mobilization  Grade 3 mobs in all planes with emphasis on upward rotation       Trigger Point Dry Needling - 11/21/17 0924    Consent Given?  Yes    Education Handout Provided  No given previously    Muscles Treated Upper Body  Upper trapezius;Rhomboids;Levator scapulae    Upper Trapezius Response  Twitch reponse elicited;Palpable increased muscle length    Levator Scapulae Response  Twitch response elicited;Palpable increased muscle length    Rhomboids Response  Twitch response elicited;Palpable increased muscle length           PT Education - 11/21/17 1011    Education provided  Yes    Education Details  reviewed isometrics and benefits of DN.     Person(s) Educated  Patient  Methods  Explanation;Verbal cues    Comprehension  Verbalized understanding;Verbal cues required       PT Short Term Goals - 11/11/17 1109      PT SHORT TERM GOAL #1   Title  He will be indepnendnet with initial HEP    Status  On-going      PT SHORT TERM GOAL #2   Title  He will report pai ndecr  40% or more with moving Lt arm    Baseline  improved to 3/10 from 8 /10    Status  Achieved        PT Long Term Goals - 10/21/17 0751      PT LONG TERM GOAL #1   Title  He will be independent with all HEp issued    Time  6    Period  Weeks    Status  New      PT LONG TERM GOAL #2   Title  He will report no pain at rest AND 75% decreased pain with dressing and self care    Time  6    Period  Weeks    Status  New      PT LONG TERM GOAL #3   Title  He will be able to lift 10-15 pounds  with Lt arm with 1-2 max pain    Time  6    Period  Weeks    Status  New      PT LONG TERM GOAL #4   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 Reviewed DN and performed on the L Rhomboids, upper trap and levator. Followed DN with soft tissue work and continued isometric strengthening which he had most difficulty with ER. continued MHP post session to calm down soreness from DN.     PT Next Visit Plan  response to DN, Continue STW , modalities , proggress ROM /strength, DN And review isometrics  exercsies    PT Home Exercise Plan  isometrics    Consulted and Agree with Plan of Care  Patient       Patient will benefit from skilled therapeutic intervention in order to improve the following deficits and impairments:  Pain, Increased muscle spasms, Decreased activity tolerance, Impaired UE functional use  Visit Diagnosis: Cramp and spasm  Left shoulder pain, unspecified chronicity     Problem List Patient Active Problem List   Diagnosis Date Noted  . Hyperlipidemia 09/26/2017  . Rash and nonspecific skin eruption 09/26/2017  . Obesity 09/13/2017  . Nondisplaced fracture of acromial process, left shoulder, subsequent encounter for fracture with routine healing 09/13/2017  . Blindness of both eyes 12/17/2009   Lulu RidingKristoffer Demarqus Hale PT, DPT, LAT, ATC  11/21/17  10:15 AM      [facility] [address] [place], [place], [address] Phone: [phone]   Fax:  [phone]  Name: William ArtistScott M Hale MRN: [id number] Date of Birth: 1961-01-17

## 2017-11-22 ENCOUNTER — Ambulatory Visit: Payer: Medicare Other | Admitting: Family Medicine

## 2017-11-25 ENCOUNTER — Encounter: Payer: Medicare Other | Admitting: Physical Therapy

## 2017-11-25 ENCOUNTER — Ambulatory Visit: Payer: Medicare Other | Admitting: Physical Therapy

## 2017-11-28 ENCOUNTER — Ambulatory Visit: Payer: Medicare Other

## 2017-11-28 DIAGNOSIS — M25512 Pain in left shoulder: Secondary | ICD-10-CM

## 2017-11-28 DIAGNOSIS — R252 Cramp and spasm: Secondary | ICD-10-CM | POA: Diagnosis not present

## 2017-11-28 NOTE — Therapy (Signed)
[facility] [address] [place], [place], [address] Phone: [phone]   Fax:  [phone]  Physical Therapy Treatment  Patient Details  Name: William Hale MRN: [id number] Date of Birth: 1961-09-29 Referring Provider: Margarita RanaImothy Murphy, MD    Encounter Date: 11/28/2017  PT End of Session - 11/28/17 0927    Visit Number  9    Number of Visits  12    Date for PT Re-Evaluation  11/29/17    Authorization Type  U[employer] MCR    PT Start Time  0918    PT Stop Time  1010    PT Time Calculation (min)  52 min    Activity Tolerance  Patient tolerated treatment well    Behavior During Therapy  [facility] for tasks assessed/performed       Past Medical History:  Diagnosis Date  . Asthma    childhood  . Blindness of both eyes 2011   after sick with high fever, went blind after    Past Surgical History:  Procedure Laterality Date  . CYST EXCISION Right age 89   R elbow cyst removal    There were no vitals filed for this visit.  Subjective Assessment - 11/28/17 0928    Subjective  More sore last 2 days but overall feel better with pain decr 1.5   with reaching and in general.     Currently in Pain?  Yes    Pain Score  -- 1.5    Pain Location  Shoulder    Pain Orientation  Left    Pain Descriptors / Indicators  Aching    Pain Type  Chronic pain    Pain Onset  More than a month ago    Pain Frequency  Constant    Aggravating Factors   lifting , quick movements , stretch ER     Pain Relieving Factors  limit using arm overheada         [facility] PT Assessment - 11/28/17 0001      AROM   Left Shoulder Flexion  148 Degrees    Left Shoulder ABduction  150 Degrees    Left Shoulder Internal Rotation  50 Degrees    Left Shoulder External Rotation  98 Degrees                  [facility] Adult PT Treatment/Exercise - 11/28/17 0001      Shoulder Exercises: Seated   Other Seated Exercises  red band x12 reps flex/ext/IR /ER .  audible  click on return from forward flex position , reports some incr soreness but no sharp pains today       Shoulder Exercises: Pulleys   Flexion  3 minutes      Shoulder Exercises: ROM/Strengthening   UBE (Upper Arm Bike)  L1 5 min forward      Ultrasound   Ultrasound Location  LT shoulder    Ultrasound Parameters  100% 1 Mhz 1.2 Wcm2      Manual Therapy   Soft tissue mobilization  IASTM over levator scapulae, upper trap, Rhomboids    Myofascial Release  fascial rolling and stretching of the upper trap, levator and Rhomboids on the L               PT Short Term Goals - 11/28/17 1008      PT SHORT TERM GOAL #1   Title  He will be indepnendnet with initial HEP    Baseline  he is able to  do isometrics    Status  Achieved      PT SHORT TERM GOAL #2   Title  He will report pai ndecr  40% or more with moving Lt arm    Baseline  improved to 1/5 /10    Status  Achieved        PT Long Term Goals - 11/28/17 1009      PT LONG TERM GOAL #1   Title  He will be independent with all HEp issued    Baseline  progressing with band now    Status  On-going      PT LONG TERM GOAL #2   Title  He will report no pain at rest AND 75% decreased pain with dressing and self care    Baseline  improved with movement but has pain/soreness at rest    Status  On-going      PT LONG TERM GOAL #3   Title  He will be able to lift 10-15 pounds  with Lt arm with 1-2 max pain    Baseline  Not able to lift 10 pounds    Status  On-going      PT LONG TERM GOAL #4   Title  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   PROM WNL with end range pain . He is improving but slowly .  It is not clesar he dould tolerate repetative motion for job as he is still cautious with repetative movments in clinic.   AROM + RT but with pain    PT Frequency   2x / week    PT Duration  4 weeks    PT Treatment/Interventions  Vasopneumatic Device;Taping;Manual techniques;Dry needling;Patient/family education;Therapeutic exercise;Cryotherapy;Iontophoresis 4mg /ml Dexamethasone;Moist Heat;Electrical Stimulation    PT Next Visit Plan  response to DN, Continue STW , modalities , proggress ROM /strength, DN And review isometrics  exercsies    PT Home Exercise Plan  isometrics    Consulted and Agree with Plan of Care  Patient       Patient will benefit from skilled therapeutic intervention in order to improve the following deficits and impairments:  Pain, Increased muscle spasms, Decreased activity tolerance, Impaired UE functional use  Visit Diagnosis: Left shoulder pain, unspecified chronicity  Cramp and spasm     Problem List Patient Active Problem List   Diagnosis Date Noted  . Hyperlipidemia 09/26/2017  . Rash and nonspecific skin eruption 09/26/2017  . Obesity 09/13/2017  . Nondisplaced fracture of acromial process, left shoulder, subsequent encounter for fracture with routine healing 09/13/2017  . Blindness of both eyes 12/17/2009    Caprice RedChasse, Lilliah Priego M   PT 11/28/2017, 10:27 AM  [facility] [address] [place], [place], [address] Phone: [phone]   Fax:  [phone]  Name: William Hale MRN: [id number] Date of Birth: Mar 13, 1961

## 2017-11-29 DIAGNOSIS — M25512 Pain in left shoulder: Secondary | ICD-10-CM | POA: Diagnosis not present

## 2017-12-02 ENCOUNTER — Ambulatory Visit: Payer: Medicare Other | Admitting: Physical Therapy

## 2017-12-02 ENCOUNTER — Encounter: Payer: Self-pay | Admitting: Physical Therapy

## 2017-12-02 DIAGNOSIS — M25512 Pain in left shoulder: Secondary | ICD-10-CM

## 2017-12-02 DIAGNOSIS — R252 Cramp and spasm: Secondary | ICD-10-CM | POA: Diagnosis not present

## 2017-12-02 NOTE — Therapy (Signed)
[facility] [address] [place], [place], [address] Phone: [phone]   Fax:  [phone]  Physical Therapy Treatment / Re-certification  Patient Details  Name: William Hale MRN: [id number] Date of Birth: 01-Mar-1961 Referring Provider: Margarita RanaImothy Murphy, MD    Encounter Date: 12/02/2017  PT End of Session - 12/02/17 0941    Visit Number  10    Number of Visits  13    Date for PT Re-Evaluation  12/30/17    Authorization Type  UHC MCR    PT Start Time  0931    PT Stop Time  1023    PT Time Calculation (min)  52 min    Activity Tolerance  Patient tolerated treatment well    Behavior During Therapy  [facility] for tasks assessed/performed       Past Medical History:  Diagnosis Date  . Asthma    childhood  . Blindness of both eyes 2011   after sick with high fever, went blind after    Past Surgical History:  Procedure Laterality Date  . CYST EXCISION Right age 89   R elbow cyst removal    There were no vitals filed for this visit.  Subjective Assessment - 12/02/17 0930    Subjective  "I am sore today, I've been sore since last thursday. It feels like I am getting punched"    Currently in Pain?  Yes    Pain Score  3     Pain Orientation  Left    Pain Onset  More than a month ago    Pain Frequency  Constant    Aggravating Factors   when i am trying to move it back, and lifting over the head    Pain Relieving Factors  medication, using the arm         [facility] - 12/02/17 0933      ROM / Strength   AROM / PROM / Strength  Strength      AROM   Overall AROM Comments  avoided ER/IR meaures at 90/90 due to pain with abduction    Left Shoulder Flexion  154 Degrees ERP     Left Shoulder ABduction  120 Degrees limited due to pain    Left Shoulder Internal Rotation  80 Degrees in nuetral reaching hand to the stomach    Left Shoulder External Rotation  60 Degrees      PROM   Right/Left Shoulder  Left      Strength   Strength Assessment Site  Shoulder    Right/Left Shoulder  Left    Left Shoulder Flexion  4-/5    Left Shoulder Extension  4/5    Left Shoulder ABduction  4-/5    Left Shoulder Internal Rotation  4+/5    Left Shoulder External Rotation  4+/5                  [facility] Adult PT Treatment/Exercise - 12/02/17 1001      Shoulder Exercises: Seated   Row  Both;15 reps;Theraband    Theraband Level (Shoulder Row)  Level 2 (Red)    External Rotation  Strengthening;Left;10 reps;Theraband    Theraband Level (Shoulder External Rotation)  Level 2 (Red)    Internal Rotation  Strengthening;Left;15 reps;Theraband    Theraband Level (Shoulder Internal Rotation)  Level 2 (Red)      Shoulder Exercises: ROM/Strengthening   UBE (Upper Arm Bike)  L1 x 6 min changing direction at 3 min  Other ROM/Strengthening Exercises  1 x 10 shoulder flexion/ abduction combined with scapular upward assist      Moist Heat Therapy   Number Minutes Moist Heat  10 Minutes    Moist Heat Location  Shoulder supine      Manual Therapy   Manual therapy comments  Skilled palpation and monitoring throughout DN. MTPR for subclavius x 3    Joint Mobilization  grade 3 inferior/ posterior distal clavicle mobs    Soft tissue mobilization  IASTM over L upper trap    Scapular Mobilization  scapular upward assist with flexion/ abduction       Trigger Point Dry Needling - 12/02/17 1017    Consent Given?  Yes    Education Handout Provided  No given previously    Upper Trapezius Response  Twitch reponse elicited;Palpable increased muscle length L             PT Short Term Goals - 12/02/17 0939      PT SHORT TERM GOAL #1   Title  He will be indepnendnet with initial HEP    Time  3    Period  Weeks    Status  Achieved      PT SHORT TERM GOAL #2   Title  He will report pai ndecr  40% or more with moving Lt arm    Time  3    Period  Weeks    Status  Achieved        PT Long Term Goals -  12/02/17 1610      PT LONG TERM GOAL #1   Title  He will be independent with all HEp issued    Baseline  progressing with band now    Time  6    Period  Weeks    Status  On-going      PT LONG TERM GOAL #2   Title  He will report no pain at rest AND 75% decreased pain with dressing and self care    Baseline  at rest and with activity 50% reduction of pain    Time  6    Period  Weeks    Status  On-going      PT LONG TERM GOAL #3   Title  He will be able to lift 10-15 pounds  with Lt arm with 1-2 max pain    Baseline  Not able to lift 10 pounds    Time  6    Period  Weeks    Status  On-going      PT LONG TERM GOAL #4   Title  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 William Hale continues to make progress with shoulder mobility except with abduction secondary to increased soreness today. He is progresswing well towards his goals. continued TPDN focused on the L upper trap with soft tissue techniques. Continued working on scapular stability and shoulder strengthening which he performed well. He would benefit from conitnued physical therapy to promote shoulder strength, mobility and maximize his function and work toward remaining goals.     PT Frequency  2x / week    PT Duration  2 weeks    PT Treatment/Interventions  Vasopneumatic Device;Taping;Manual techniques;Dry needling;Patient/family education;Therapeutic exercise;Cryotherapy;Iontophoresis 4mg /ml Dexamethasone;Moist Heat;Electrical  Stimulation    PT Next Visit Plan  response to DN, Continue STW , modalities , proggress ROM /strength, DN And review isometrics  exercsies    PT Home Exercise Plan  isometrics    Consulted and Agree with Plan of Care  Patient       Patient will benefit from skilled therapeutic intervention in order to improve the following deficits and impairments:  Pain, Increased  muscle spasms, Decreased activity tolerance, Impaired UE functional use  Visit Diagnosis: Left shoulder pain, unspecified chronicity - Plan: PT plan of care cert/re-cert  Cramp and spasm - Plan: PT plan of care cert/re-cert     Problem List Patient Active Problem List   Diagnosis Date Noted  . Hyperlipidemia 09/26/2017  . Rash and nonspecific skin eruption 09/26/2017  . Obesity 09/13/2017  . Nondisplaced fracture of acromial process, left shoulder, subsequent encounter for fracture with routine healing 09/13/2017  . Blindness of both eyes 12/17/2009   Lulu RidingKristoffer Rosemary Pentecost PT, DPT, LAT, ATC  12/02/17  10:20 AM      [facility] [address] [place], [place], [address] Phone: [phone]   Fax:  [phone]  Name: William Hale MRN: [id number] Date of Birth: 1961-10-13

## 2017-12-05 ENCOUNTER — Ambulatory Visit: Payer: Medicare Other

## 2017-12-11 ENCOUNTER — Ambulatory Visit (INDEPENDENT_AMBULATORY_CARE_PROVIDER_SITE_OTHER): Payer: Medicare Other | Admitting: Family Medicine

## 2017-12-11 ENCOUNTER — Other Ambulatory Visit: Payer: Self-pay

## 2017-12-11 ENCOUNTER — Encounter: Payer: Self-pay | Admitting: Family Medicine

## 2017-12-11 DIAGNOSIS — R21 Rash and other nonspecific skin eruption: Secondary | ICD-10-CM | POA: Diagnosis not present

## 2017-12-11 DIAGNOSIS — E785 Hyperlipidemia, unspecified: Secondary | ICD-10-CM

## 2017-12-11 MED ORDER — TETANUS-DIPHTH-ACELL PERTUSSIS 5-2.5-18.5 LF-MCG/0.5 IM SUSP: 0.5000 mL | Freq: Once | INTRAMUSCULAR | 0 refills | Status: AC

## 2017-12-11 NOTE — Patient Instructions (Addendum)
[facility] [address], [place], [place] [address] [phone]   Aquaphor or Eucerin for good skin protection and barrier. Gold bond works well too.  Stop doxycycline. Stop triamcinolone cream which is a steroid.

## 2017-12-11 NOTE — Assessment & Plan Note (Signed)
Tolerating atorvastatin 40mg  qd well and has refills at this time. Continue current management.

## 2017-12-11 NOTE — Progress Notes (Signed)
    Subjective:  William Hale is a 56 y.o. male [facility]  HPI:  Shoulder injury healed - Had made this appointment because thought he needed to be released to go back to work by PCP but was already released to go back to full work duties by [employer].  - Doing well, no pain - Still going to PT which he finds very helpful  Skin rash "staph infection" - Saw his dermatologist who gave him a refill on doxycycline and told him that he did not need to follow up. Patient states he has been taking regularly. - Has also been been using kenalog cream regularly as well - feels like rash is a lot better, less bumpy to touch.  - Picks a lot at his skin at baseline - no bleeding or fever/chills or warmth  Hyperlipidemia - Was able to start atorvastatin, taking regularly, tolerating well - no CP or myalgias or nausea/vomiting  ROS: Per HPI  Objective:  Physical Exam: BP 134/64   Pulse 85   Temp 98.6 F (37 C) (Oral)   Ht 5\' 6"  (1.676 m)   Wt 217 lb (98.4 kg)   SpO2 98%   BMI 35.02 kg/m   Gen: NAD, resting comfortably, sunglasses in place CV: RRR with no murmurs appreciated Pulm: NWOB, CTAB with no crackles, wheezes, or rhonchi GI: Normal bowel sounds present. Soft, Nontender, Nondistended. MSK: no edema, cyanosis, or clubbing noted Skin: healed small rough scars diffusely all over upper extremities in a fine close pattern with some scattered scabs. No erythema or warmth. Psych: Normal affect and thought content   Assessment/Plan:  Hyperlipidemia Tolerating atorvastatin 40mg  qd well and has refills at this time. Continue current management.  Rash and nonspecific skin eruption No records received from dermatology but appears to be from patient picking at his own skin. No signs or symptoms of infection today. Stop doxycycline and kenalog. Discussed avoidance of trauma and barrier moisturizing cream as prevention.   William HerElsia J Teddy Pena,  DO PGY-2, [facility] 12/11/2017 8:44 AM

## 2017-12-11 NOTE — Assessment & Plan Note (Signed)
No records received from dermatology but appears to be from patient picking at his own skin. No signs or symptoms of infection today. Stop doxycycline and kenalog. Discussed avoidance of trauma and barrier moisturizing cream as prevention.

## 2017-12-12 ENCOUNTER — Encounter: Payer: Self-pay | Admitting: Physical Therapy

## 2017-12-12 ENCOUNTER — Ambulatory Visit: Payer: Medicare Other | Admitting: Physical Therapy

## 2017-12-12 DIAGNOSIS — R252 Cramp and spasm: Secondary | ICD-10-CM

## 2017-12-12 DIAGNOSIS — M25512 Pain in left shoulder: Secondary | ICD-10-CM

## 2017-12-12 NOTE — Therapy (Addendum)
[place], [place], [address] Phone: [phone]   Fax:  [phone]  Physical Therapy Treatment / Discharge Summary  Patient Details  Name: SAMIE REASONS MRN: [id number] Date of Birth: 06-20-1961 Referring Provider: Edmonia Lynch, MD    Encounter Date: 12/12/2017  PT End of Session - 12/12/17 0945    Visit Number  11    Number of Visits  13    Date for PT Re-Evaluation  12/30/17    PT Start Time  0900    PT Stop Time  0950    PT Time Calculation (min)  50 min    Activity Tolerance  Patient tolerated treatment well    Behavior During Therapy  [facility] for tasks assessed/performed       Past Medical History:  Diagnosis Date  . Asthma    childhood  . Blindness of both eyes 2011   after sick with high fever, went blind after    Past Surgical History:  Procedure Laterality Date  . CYST EXCISION Right age 11   R elbow cyst removal    There were no vitals filed for this visit.  Subjective Assessment - 12/12/17 0857    Subjective  "I had a tetanus shot in the shoulder and it making me sore,but lifting and moving the arm around has gotten better"     Currently in Pain?  Yes    Pain Score  -- .5/10    Pain Orientation  Left    Pain Descriptors / Indicators  Sore    Aggravating Factors   leaning onto my l arm    Pain Relieving Factors  moving the arm around                      [facility] Adult PT Treatment/Exercise - 12/12/17 0904      Self-Care   Self-Care  Other Self-Care Comments    Other Self-Care Comments   how to peroform manual trigger point release and benefits of doing it at home      Shoulder Exercises: Supine   Other Supine Exercises  horizontal abduction 1 x 12  with green theraband, tactile cues for proper form      Shoulder Exercises: Seated   Row  Both;15 reps;Theraband x 2 sets    Theraband Level (Shoulder Row)  Level 3 (Green)    External Rotation  Strengthening;15  reps;Theraband    Theraband Level (Shoulder External Rotation)  Level 3 (Green)    Internal Rotation  Strengthening;Left;15 reps;Theraband    Theraband Level (Shoulder Internal Rotation)  Level 3 (Green)    Other Seated Exercises  lower trap strengthening 2 x 10 with red theraband      Shoulder Exercises: Standing   Other Standing Exercises  shoulder scaption 2 x 10 with 1# bil      Shoulder Exercises: ROM/Strengthening   UBE (Upper Arm Bike)  L1 x 8 min 4 min forward/ 4 min backward      Shoulder Exercises: Stretch   Other Shoulder Stretches  upper trap stretch 2 x 30 sec hold LUE only    Other Shoulder Stretches  rhomboid stretch 2 x 30 sec hold tactile cues for proper form      Moist Heat Therapy   Number Minutes Moist Heat  10 Minutes    Moist Heat Location  Shoulder               PT Short Term Goals -  12/02/17 0939      PT SHORT TERM GOAL #1   Title  He will be indepnendnet with initial HEP    Time  3    Period  Weeks    Status  Achieved      PT SHORT TERM GOAL #2   Title  He will report pai ndecr  40% or more with moving Lt arm    Time  3    Period  Weeks    Status  Achieved        PT Long Term Goals - 12/02/17 0263      PT LONG TERM GOAL #1   Title  He will be independent with all HEp issued    Baseline  progressing with band now    Time  6    Period  Weeks    Status  On-going      PT LONG TERM GOAL #2   Title  He will report no pain at rest AND 75% decreased pain with dressing and self care    Baseline  at rest and with activity 50% reduction of pain    Time  6    Period  Weeks    Status  On-going      PT LONG TERM GOAL #3   Title  He will be able to lift 10-15 pounds  with Lt arm with 1-2 max pain    Baseline  Not able to lift 10 pounds    Time  6    Period  Weeks    Status  On-going      PT LONG TERM GOAL #4   Title  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 Casale reports no pain today and only has pain with leaning on the LUE. focused today on strengthening and scapular stability with increased reps/ resistance to promote endurance. He reported minimal soreness which he attributed to getting his tetanus injection but overall reported he was doing well. If pt continues to do well plan in the next visit to possibly discharge.     PT Next Visit Plan  response to DN, Continue STW , modalities , proggress ROM /strength, DN And review isometrics  exercsies, Discuss possible d/C    PT Home Exercise Plan  isometrics    Consulted and Agree with Plan of Care  Patient       Patient will benefit from skilled therapeutic intervention in order to improve the following deficits and impairments:  Pain, Increased muscle spasms, Decreased activity tolerance, Impaired UE functional use  Visit Diagnosis: Left shoulder pain, unspecified chronicity  Cramp and spasm     Problem List Patient Active Problem List   Diagnosis Date Noted  . Hyperlipidemia 09/26/2017  . Rash and nonspecific skin eruption 09/26/2017  . Obesity 09/13/2017  . Nondisplaced fracture of acromial process, left shoulder, subsequent encounter for fracture with routine healing 09/13/2017  . Blindness of both eyes 12/17/2009   Starr Lake PT, DPT, LAT, ATC  12/12/17  9:48 AM      [facility] [address] [place], [place], [address] Phone: [phone]   Fax:  [phone]  Name: ABDULRAHEEM PINEO MRN: [id number] Date of Birth: 10-30-1961  PHYSICAL THERAPY DISCHARGE SUMMARY  Visits from Start of Care: 11  Current functional level related to goals / functional outcomes: See goals   Remaining deficits: unknown   Education / Equipment: HEP,   Plan: Patient agrees to discharge.  Patient goals were not met. Patient is being discharged due  to not returning since the last visit.  ?????         Cassandre Oleksy PT, DPT, LAT, ATC  01/22/18  11:40 AM

## 2017-12-16 ENCOUNTER — Ambulatory Visit: Payer: Medicare Other | Admitting: Physical Therapy

## 2017-12-23 ENCOUNTER — Ambulatory Visit: Payer: Medicare Other

## 2017-12-30 ENCOUNTER — Ambulatory Visit: Payer: Medicare Other | Attending: Orthopedic Surgery

## 2018-01-30 ENCOUNTER — Other Ambulatory Visit: Payer: Self-pay

## 2018-01-30 MED ORDER — ATORVASTATIN CALCIUM 40 MG PO TABS: 40.0000 mg | ORAL_TABLET | Freq: Every day | ORAL | 3 refills | Status: DC

## 2018-04-21 ENCOUNTER — Encounter: Payer: Self-pay | Admitting: Family Medicine

## 2018-04-21 ENCOUNTER — Other Ambulatory Visit: Payer: Self-pay

## 2018-04-21 ENCOUNTER — Ambulatory Visit (INDEPENDENT_AMBULATORY_CARE_PROVIDER_SITE_OTHER): Payer: Medicare Other | Admitting: Family Medicine

## 2018-04-21 DIAGNOSIS — R1031 Right lower quadrant pain: Secondary | ICD-10-CM | POA: Diagnosis not present

## 2018-04-21 NOTE — Progress Notes (Signed)
    Subjective:  William Hale is a 57 y.o. male who presents to the [facility] today with a chief complaint of right hip pain.   HPI:  R hip pain for the last 1 month, anteriorly in the groin region.  This occurs intermittently, only happens when he is actually getting up or sitting up. Does not occur with weight bearing.  He has not fallen or had any recent trauma Feels like sharp pain in L groin that is sudden.  Not exacerbated with bearing down or sneezing.  Does not have any specific abdominal pain, nausea, vomiting.  He does not have any urinary symptoms or testicular pain. He has taken some over-the-counter ibuprofen for this which may have helped a little. Has not noticed any difference in the activities he is able to do.  He is still continue to be able to work. No weakness, numbness, tingling  ROS: Per HPI  Objective:  Physical Exam: BP 124/78   Pulse 77   Temp 98.9 F (37.2 C) (Oral)   Ht  (1.676 m)   Wt 209 lb (94.8 kg)   SpO2 97%   BMI 33.73 kg/m   Gen: NAD, resting comfortably CV: RRR with no murmurs appreciated Pulm: NWOB, CTAB with no crackles, wheezes, or rhonchi GI: Tender palpation over right inguinal canal.  No bulging with increased intra-abdominal pressure.  Normal bowel sounds present. Soft, Nondistended.  MSK: Right hip joint nontender to palpation.  No pain with internal/external rotation.  Negative FABER and FADIR.  5/5 strength in LE. Sensation intact. Patellar DTR 2+.  Assessment/Plan:  1. Groin pain, right Concern for inguinal hernia considering tender to palpation of her inguinal canal.  No signs or symptoms of strangulation.  Differential also includes referred pain from arthritis however negative FABER and FADIR.  Will obtain CT pelvis to evaluate for hernia. - CT Abdomen Pelvis Wo Contrast; Future  Leland Her, DO PGY-2, [facility] 04/21/2018 3:40 PM

## 2018-04-21 NOTE — Patient Instructions (Addendum)
Please get a CT done to evaluate for a hernia.   Hernia A hernia happens when an organ or tissue inside your body pushes out through a weak spot in the belly (abdomen). Follow these instructions at home:  Avoid stretching or overusing (straining) the muscles near the hernia.  Do not lift anything heavier than 10 lb (4.5 kg).  Use the muscles in your leg when you lift something up. Do not use the muscles in your back.  When you cough, try to cough gently.  Eat a diet that has a lot of fiber. Eat lots of fruits and vegetables.  Drink enough fluids to keep your pee (urine) clear or pale yellow. Try to drink 6-8 glasses of water a day.  Take medicines to make your poop soft (stool softeners) as told by your doctor.  Lose weight, if you are overweight.  Do not use any tobacco products, including cigarettes, chewing tobacco, or electronic cigarettes. If you need help quitting, ask your doctor.  Keep all follow-up visits as told by your doctor. This is important. Contact a doctor if:  The skin by the hernia gets puffy (swollen) or red.  The hernia is painful. Get help right away if:  You have a fever.  You have belly pain that is getting worse.  You feel sick to your stomach (nauseous) or you throw up (vomit).  You cannot push the hernia back in place by gently pressing on it while you are lying down.  The hernia: ? Changes in shape or size. ? Is stuck outside your belly. ? Changes color. ? Feels hard or tender. This information is not intended to replace advice given to you by your health care provider. Make sure you discuss any questions you have with your health care provider. Document Released: 05/23/2010 Document Revised: 05/10/2016 Document Reviewed: 10/13/2014 Elsevier Interactive Patient Education  [employer].

## 2018-04-25 ENCOUNTER — Ambulatory Visit
Admission: RE | Admit: 2018-04-25 | Discharge: 2018-04-25 | Disposition: A | Discharge status: Home / Self Care | Payer: Medicare Other | Source: Ambulatory Visit | Attending: Family Medicine | Admitting: Family Medicine

## 2018-04-25 DIAGNOSIS — R1031 Right lower quadrant pain: Secondary | ICD-10-CM

## 2018-04-25 DIAGNOSIS — K429 Umbilical hernia without obstruction or gangrene: Secondary | ICD-10-CM | POA: Diagnosis not present

## 2018-04-30 ENCOUNTER — Telehealth: Payer: Self-pay

## 2018-04-30 ENCOUNTER — Encounter: Payer: Self-pay | Admitting: Family Medicine

## 2018-04-30 NOTE — Telephone Encounter (Signed)
Pt calling for CT results Call back number [phone] Shawna Orleans, RN

## 2018-04-30 NOTE — Telephone Encounter (Signed)
Attempted to call patient twice. No option to leave message.

## 2018-06-19 DIAGNOSIS — S46812A Strain of other muscles, fascia and tendons at shoulder and upper arm level, left arm, initial encounter: Secondary | ICD-10-CM | POA: Diagnosis not present

## 2018-06-19 DIAGNOSIS — M542 Cervicalgia: Secondary | ICD-10-CM | POA: Diagnosis not present

## 2018-08-16 ENCOUNTER — Encounter

## 2018-09-30 DIAGNOSIS — Z23 Encounter for immunization: Secondary | ICD-10-CM | POA: Diagnosis not present

## 2019-03-02 ENCOUNTER — Telehealth: Payer: Self-pay | Admitting: Family Medicine

## 2019-03-02 NOTE — Telephone Encounter (Signed)
Pt called and asked to come in to be tested for the Coronavirus. I asked the patient if he was sick traveled or been exposed to someone with COVID 19. Pt said he was not sick and not having any symptoms. He also said "well how would I know if I had been exposed."  I explained to the patient that we are not testing anyone unless they have the symptoms or have been exposed to someone with the virus.   The patient became very upset saying "Well I saw on the news that you can just drive up and get free testing for the virus." I explained that I was sorry but that was not our policy here at our office.  Pt then said " Why would they have it on the news if it wasn't true?" I explained again that is not our office policy.   Pt then said " well I guess if I get it then I will come shoot somebody" pt laughed afterwards and hung up.   I went and told Annice Pih what was said during the phone call.

## 2019-03-02 NOTE — Telephone Encounter (Signed)
He did not pick up his home number and I was unable to leave a message. His mobile number is not in service.  I received a report from the front office staff that the patient  Threatened to come shoot the clinic up after being denied COVID-19 testing.  Ideally, this call should have been routed to the preceptor but was not.   According to our policy, this patient qualifies for immediate dismissal.  I called and spoke with [facility] security agent " Curt Jews"  Who will send someone over immediately and notify the law enforcement agents.  I will mail him a dismissal letter.

## 2019-03-04 NOTE — Telephone Encounter (Signed)
I have called all the phone numbers listed for him without success.  Whenever he calls back please let him know the following:  1. Clinic director tried to reach him multiple times with no success. 2. Due to the violent threat he made against our staff, he has been dismissed from our practice. 3. We will be able to offer him urgent care for the next 30 days via telephone call with his PCP or the covering provider. 4. We will be able to refill his medications for the next 30 days. 5. We are not able to see him physically in our clinic/building due to the threat he made.  Thanks.

## 2019-03-04 NOTE — Telephone Encounter (Signed)
Pt called back and I read off William Hale's last message to him. Pt wasn't happy, but also took it better than imagined. He said 'well it was a joke, I wasn't really going to shoot anyone. I'm blind, I cant shoot someone.'  Pt was understanding and apologized.

## 2019-03-04 NOTE — Telephone Encounter (Signed)
Noted and agree. 

## 2019-03-04 NOTE — Telephone Encounter (Signed)
I called the number provided multiple times without response. I will try his other numbers soon.

## 2019-03-04 NOTE — Telephone Encounter (Signed)
Pt called asking to make an appt - pt isn't aware of the information below. Called Dr. Lum Babe on her cell phone to see what she wanted to do. She asked that I get a good call back number for him so she could call him back.  Pt's callback number is [phone].

## 2019-03-10 ENCOUNTER — Encounter (HOSPITAL_COMMUNITY): Payer: Self-pay

## 2019-03-10 ENCOUNTER — Other Ambulatory Visit: Payer: Self-pay

## 2019-03-10 ENCOUNTER — Emergency Department (HOSPITAL_COMMUNITY)
Admission: EM | Admit: 2019-03-10 | Discharge: 2019-03-10 | Disposition: A | Discharge status: Home / Self Care | Payer: Medicare Other | Attending: Emergency Medicine | Admitting: Emergency Medicine

## 2019-03-10 DIAGNOSIS — F17210 Nicotine dependence, cigarettes, uncomplicated: Secondary | ICD-10-CM | POA: Diagnosis not present

## 2019-03-10 DIAGNOSIS — Z79899 Other long term (current) drug therapy: Secondary | ICD-10-CM | POA: Diagnosis not present

## 2019-03-10 DIAGNOSIS — J069 Acute upper respiratory infection, unspecified: Secondary | ICD-10-CM | POA: Diagnosis not present

## 2019-03-10 DIAGNOSIS — B9789 Other viral agents as the cause of diseases classified elsewhere: Secondary | ICD-10-CM | POA: Insufficient documentation

## 2019-03-10 DIAGNOSIS — R05 Cough: Secondary | ICD-10-CM | POA: Diagnosis not present

## 2019-03-10 DIAGNOSIS — Z0279 Encounter for issue of other medical certificate: Secondary | ICD-10-CM | POA: Diagnosis present

## 2019-03-10 NOTE — Discharge Instructions (Addendum)
You have been cleared to return to work on Friday, March 13, 2019, if you continue to not experience fever or worsening upper respiratory symptoms.  You may return to the ED for new or concerning symptoms.  Follow-up with your primary care doctor as needed.

## 2019-03-10 NOTE — ED Triage Notes (Signed)
Patient reports recent cough that resolved Friday but requires note from a doctor for work saying he can go back. Patient also reports resolved fever and SOB. Denies any symptoms at this time.

## 2019-03-10 NOTE — ED Provider Notes (Signed)
[facility] EMERGENCY DEPARTMENT Provider Note   CSN: [id number] Arrival date & time: 03/10/19  8333    History   Chief Complaint Chief Complaint  Patient presents with  . Cough    HPI William Hale is a 58 y.o. male.    58 year old male with a history of bilateral blindness presents to the emergency department requesting a work note for clearance to return to work.  Endorses upper respiratory symptoms including congestion and cough last week.  Symptoms began on 03/03/2019 and persisted for 4 days.  He had a low-grade fever of less than 101 F which resolved by last Thursday.  Reports taking over-the-counter cough medication for symptomatic management.  Has had great improvement in his cough as he originally noted it to be "hacking", but this is no longer the case.  Denies any known sick contacts or recent travel.  He has no complaints of fever, shortness of breath, nausea, vomiting, syncope.  The history is provided by the patient. No language interpreter was used.  Cough    Past Medical History:  Diagnosis Date  . Asthma    childhood  . Blindness of both eyes 2011   after sick with high fever, went blind after    Patient Active Problem List   Diagnosis Date Noted  . Hyperlipidemia 09/26/2017  . Rash and nonspecific skin eruption 09/26/2017  . Obesity 09/13/2017  . Nondisplaced fracture of acromial process, left shoulder, subsequent encounter for fracture with routine healing 09/13/2017  . Blindness of both eyes 12/17/2009    Past Surgical History:  Procedure Laterality Date  . CYST EXCISION Right age 31   R elbow cyst removal        Home Medications    Prior to Admission medications   Medication Sig Start Date End Date Taking? Authorizing Provider  atorvastatin (LIPITOR) 40 MG tablet Take 1 tablet (40 mg total) by mouth daily. 01/30/18   Leland Her, DO  triamcinolone cream (KENALOG) 0.1 % Apply 1 application topically 2 (two) times daily.  09/26/17   Leland Her, DO    Family History Family History  Problem Relation Age of Onset  . Heart disease Father   . Diabetes Father   . Emphysema Maternal Grandfather   . Diabetes Cousin     Social History Social History   Tobacco Use  . Smoking status: Current Every Day Smoker    Packs/day: 0.50    Years: 20.00    Pack years: 10.00    Types: Cigarettes  . Smokeless tobacco: Never Used  Substance Use Topics  . Alcohol use: Yes    Comment: occasionally, on holidays  . Drug use: No     Allergies   Patient has no known allergies.   Review of Systems Review of Systems  Respiratory: Positive for cough.   Ten systems reviewed and are negative for acute change, except as noted in the HPI.    Physical Exam Updated Vital Signs BP 121/75   Pulse 71   Temp 98.7 F (37.1 C) (Oral)   Resp 15   Ht 5\' 6"  (1.676 m)   Wt 93.9 kg   SpO2 99%   BMI 33.41 kg/m   Physical Exam Vitals signs and nursing note reviewed.  Constitutional:      General: He is not in acute distress.    Appearance: He is well-developed. He is not diaphoretic.     Comments: Nontoxic appearing and in NAD  HENT:  Head: Normocephalic and atraumatic.  Eyes:     General: No scleral icterus.    Conjunctiva/sclera: Conjunctivae normal.  Neck:     Musculoskeletal: Normal range of motion.  Cardiovascular:     Rate and Rhythm: Normal rate and regular rhythm.     Pulses: Normal pulses.  Pulmonary:     Effort: Pulmonary effort is normal. No respiratory distress.     Breath sounds: No stridor. No wheezing, rhonchi or rales.     Comments: Lungs CTAB. Respirations even and unlabored. Musculoskeletal: Normal range of motion.  Skin:    General: Skin is warm and dry.     Coloration: Skin is not pale.     Findings: No erythema or rash.  Neurological:     Mental Status: He is alert and oriented to person, place, and time.     Coordination: Coordination normal.  Psychiatric:        Behavior:  Behavior normal.      ED Treatments / Results  Labs (all labs ordered are listed, but only abnormal results are displayed) Labs Reviewed - No data to display  EKG None  Radiology No results found.  Procedures Procedures (including critical care time)  Medications Ordered in ED Medications - No data to display   Initial Impression / Assessment and Plan / ED Course  I have reviewed the triage vital signs and the nursing notes.  Pertinent labs & imaging results that were available during my care of the patient were reviewed by me and considered in my medical decision making (see chart for details).        Patient afebrile with recent upper respiratory symptoms.  Presently asymptomatic.  Has no hypoxia while in the ED.  Lung sounds clear.  He is requesting a work note for clearance to return to his job.  Will allow patient to be cleared once he has been fever free for 1 week.  Low risk for COVID given lack of sick contacts or recent travel.  Return precautions discussed and provided. Patient discharged in stable condition with no unaddressed concerns.   Final Clinical Impressions(s) / ED Diagnoses   Final diagnoses:  Viral URI with cough    ED Discharge Orders    None       Antony Madura, PA-C 03/10/19 7681    Derwood Kaplan, MD 03/10/19 [phone]

## 2019-09-23 DIAGNOSIS — Z23 Encounter for immunization: Secondary | ICD-10-CM | POA: Diagnosis not present

## 2019-10-06 ENCOUNTER — Ambulatory Visit: Payer: Medicare Other

## 2019-10-12 ENCOUNTER — Ambulatory Visit (INDEPENDENT_AMBULATORY_CARE_PROVIDER_SITE_OTHER): Payer: Medicare Other | Admitting: Internal Medicine

## 2019-10-12 ENCOUNTER — Other Ambulatory Visit: Payer: Self-pay

## 2019-10-12 VITALS — BP 112/75 | HR 66 | Temp 98.1°F | Ht 66.0 in | Wt 202.2 lb

## 2019-10-12 DIAGNOSIS — Z720 Tobacco use: Secondary | ICD-10-CM | POA: Insufficient documentation

## 2019-10-12 DIAGNOSIS — F17210 Nicotine dependence, cigarettes, uncomplicated: Secondary | ICD-10-CM | POA: Diagnosis not present

## 2019-10-12 DIAGNOSIS — R21 Rash and other nonspecific skin eruption: Secondary | ICD-10-CM | POA: Diagnosis not present

## 2019-10-12 DIAGNOSIS — Z79899 Other long term (current) drug therapy: Secondary | ICD-10-CM

## 2019-10-12 DIAGNOSIS — E785 Hyperlipidemia, unspecified: Secondary | ICD-10-CM | POA: Diagnosis not present

## 2019-10-12 MED ORDER — VARENICLINE TARTRATE 0.5 MG X 11 & 1 MG X 42 PO MISC: ORAL | 0 refills | Status: DC

## 2019-10-12 NOTE — Patient Instructions (Signed)
Thank you for allowing [place] to provide your care. Today we discussed:  1) Rash. We will call over to the dermatology office and attempt to get records. In the meantime you can continue to use the steroid cream your previously given.  2) We are going to check some blood work today I will call you to let you know if we need to change anything.  3) I have sent out a prescription for Chantix. Please take this as prescribed.  Please come back to see [place] in three months or sooner if any issues arise.

## 2019-10-12 NOTE — Assessment & Plan Note (Addendum)
Patient presents for evaluation of a diffuse pleuritic rash on his upper extremities, legs, and back. He has previously seen dermatology and was given a steroid cream. He last saw them greater than 12 months ago and has been unable to get an appointment. He states that the rash seems to wax and wane. It is very pruritic. He states that he has had a biopsy in the past. He is unsure what the results were. He has had this rash for greater than five years.  On physical exam he has diffuse erythematous papules with sparse pustules. He has multiple excretions over these lesions. There is pairing of his upper back.  A/P: - Obtain records from Dr. Elvera Lennox at [facility] dermatology - Continue steroid cream  - Pattern does seem consistent with picking - Check HIV, BMP, and CBC  ADDENDUM: HIV negative. BMP and CBC unremarkable.

## 2019-10-12 NOTE — Progress Notes (Signed)
   CC: Rash, HLD, Tobacco use  HPI:  [name] is a 58 y.o. male with PMHx listed below presenting for Rash, HLD, Tobacco use. Please see the A&P for the status of the patient's chronic medical problems.  Past Medical History:  Diagnosis Date  . Asthma    childhood  . Blindness of both eyes 2011   after sick with high fever, went blind after   Family History  Problem Relation Age of Onset  . Heart disease Father   . Diabetes Father   . Emphysema Maternal Grandfather   . Diabetes Cousin    Past Surgical History:  Procedure Laterality Date  . 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  Physical Exam: Vitals:   10/12/19 1340  BP: 112/75  Pulse: 66  Temp: 98.1 F (36.7 C)  TempSrc: Oral  SpO2: 100%  Weight: 202 lb 3.2 oz (91.7 kg)  Height: 5\' 6"  (1.676 m)   General: Well nourished male in no acute distress HENT: Normocephalic, atraumatic, moist mucus membranes Pulm: Good air movement with no wheezing or crackles  CV: RRR, no murmurs, no rubs  Abdomen: Active bowel sounds, soft, non-distended, no tenderness to palpation  Extremities: Pulses palpable in all extremities, no LE edema  Skin: Warm and dry  Neuro: Alert and oriented x 3  Assessment & Plan:   See Encounters Tab for problem based charting.  Patient discussed with Dr. Lynnae January

## 2019-10-13 ENCOUNTER — Encounter: Payer: Self-pay | Admitting: Internal Medicine

## 2019-10-13 ENCOUNTER — Telehealth: Payer: Self-pay | Admitting: Internal Medicine

## 2019-10-13 LAB — CBC WITH DIFFERENTIAL/PLATELET
Basophils Absolute: 0.1 10*3/uL (ref 0.0–0.2)
Basos: 1 %
EOS (ABSOLUTE): 0.1 10*3/uL (ref 0.0–0.4)
Eos: 1 %
Hematocrit: 46.4 % (ref 37.5–51.0)
Hemoglobin: 16 g/dL (ref 13.0–17.7)
Immature Grans (Abs): 0 10*3/uL (ref 0.0–0.1)
Immature Granulocytes: 0 %
Lymphocytes Absolute: 1.9 10*3/uL (ref 0.7–3.1)
Lymphs: 29 %
MCH: 30 pg (ref 26.6–33.0)
MCHC: 34.5 g/dL (ref 31.5–35.7)
MCV: 87 fL (ref 79–97)
Monocytes Absolute: 0.5 10*3/uL (ref 0.1–0.9)
Monocytes: 8 %
Neutrophils Absolute: 3.9 10*3/uL (ref 1.4–7.0)
Neutrophils: 61 %
Platelets: 245 10*3/uL (ref 150–450)
RBC: 5.34 x10E6/uL (ref 4.14–5.80)
RDW: 13.3 % (ref 11.6–15.4)
WBC: 6.4 10*3/uL (ref 3.4–10.8)

## 2019-10-13 LAB — BMP8+ANION GAP
Anion Gap: 16 mmol/L (ref 10.0–18.0)
BUN/Creatinine Ratio: 13 (ref 9–20)
BUN: 12 mg/dL (ref 6–24)
CO2: 20 mmol/L (ref 20–29)
Calcium: 9.4 mg/dL (ref 8.7–10.2)
Chloride: 103 mmol/L (ref 96–106)
Creatinine, Ser: 0.94 mg/dL (ref 0.76–1.27)
GFR calc Af Amer: 103 mL/min/{1.73_m2} (ref >59)
GFR calc non Af Amer: 89 mL/min/{1.73_m2} (ref >59)
Glucose: 94 mg/dL (ref 65–99)
Potassium: 4 mmol/L (ref 3.5–5.2)
Sodium: 139 mmol/L (ref 134–144)

## 2019-10-13 LAB — LIPID PANEL
Chol/HDL Ratio: 9.4 ratio — ABNORMAL HIGH (ref 0.0–5.0)
Cholesterol, Total: 254 mg/dL — ABNORMAL HIGH (ref 100–199)
HDL: 27 mg/dL — ABNORMAL LOW (ref >39)
LDL Chol Calc (NIH): 193 mg/dL — ABNORMAL HIGH (ref 0–99)
Triglycerides: 179 mg/dL — ABNORMAL HIGH (ref 0–149)
VLDL Cholesterol Cal: 34 mg/dL (ref 5–40)

## 2019-10-13 LAB — HIV ANTIBODY (ROUTINE TESTING W REFLEX): HIV Screen 4th Generation wRfx: NONREACTIVE

## 2019-10-13 NOTE — Telephone Encounter (Signed)
Called patient to discuss his lab results. All questions and concerns addressed.   Ina Homes, MD IMTS PGY3

## 2019-10-13 NOTE — Assessment & Plan Note (Signed)
Patient currently smokes one half pack per day. He states that he is interested. We discussed all available options including nicotine replacement, gum versus patches and Chantix.   A/P: - Start Chantix

## 2019-10-13 NOTE — Assessment & Plan Note (Addendum)
Patient with known hyperlipidemia. Previous lipid panel with an elevated triglyceride at 410. Unable to calculate LDL. He is currently on atorvastatin 40 mg daily. He takes his medication at night with no apparent side effects.  A/P: - Recheck lipid panel  - Continue Atorvastatin 40 mg QD  ADDENDUM: LDL at 193. Will need to discuss augmentation therapy at his next visit.

## 2019-10-14 NOTE — Progress Notes (Signed)
Internal Medicine Clinic Attending  Case discussed with Dr. Helberg at the time of the visit.  We reviewed the resident's history and exam and pertinent patient test results.  I agree with the assessment, diagnosis, and plan of care documented in the resident's note.    

## 2019-11-03 ENCOUNTER — Telehealth: Payer: Self-pay

## 2019-11-03 NOTE — Telephone Encounter (Signed)
Requesting PA on of the meds, pt do not know the name of the meds. Please call pt back.

## 2019-11-03 NOTE — Telephone Encounter (Signed)
He needs to let us know what meds.

## 2019-11-05 NOTE — Telephone Encounter (Signed)
Patient has gotten his Chantix.  Spoke with pharmacist.  No other medication needed a PA,

## 2019-11-30 DIAGNOSIS — L0889 Other specified local infections of the skin and subcutaneous tissue: Secondary | ICD-10-CM | POA: Diagnosis not present

## 2019-11-30 DIAGNOSIS — L011 Impetiginization of other dermatoses: Secondary | ICD-10-CM | POA: Diagnosis not present

## 2019-11-30 DIAGNOSIS — L2089 Other atopic dermatitis: Secondary | ICD-10-CM | POA: Diagnosis not present

## 2020-01-12 ENCOUNTER — Encounter: Payer: Self-pay | Admitting: Internal Medicine

## 2020-01-12 ENCOUNTER — Ambulatory Visit (INDEPENDENT_AMBULATORY_CARE_PROVIDER_SITE_OTHER): Payer: Medicare Other | Admitting: Internal Medicine

## 2020-01-12 VITALS — BP 116/58 | HR 73 | Temp 98.7°F | Ht 66.0 in | Wt 206.8 lb

## 2020-01-12 DIAGNOSIS — R109 Unspecified abdominal pain: Secondary | ICD-10-CM

## 2020-01-12 DIAGNOSIS — R911 Solitary pulmonary nodule: Secondary | ICD-10-CM | POA: Diagnosis not present

## 2020-01-12 DIAGNOSIS — Z720 Tobacco use: Secondary | ICD-10-CM

## 2020-01-12 DIAGNOSIS — R21 Rash and other nonspecific skin eruption: Secondary | ICD-10-CM | POA: Diagnosis not present

## 2020-01-12 DIAGNOSIS — R918 Other nonspecific abnormal finding of lung field: Secondary | ICD-10-CM

## 2020-01-12 DIAGNOSIS — Z131 Encounter for screening for diabetes mellitus: Secondary | ICD-10-CM | POA: Diagnosis not present

## 2020-01-12 DIAGNOSIS — E785 Hyperlipidemia, unspecified: Secondary | ICD-10-CM

## 2020-01-12 DIAGNOSIS — Z1211 Encounter for screening for malignant neoplasm of colon: Secondary | ICD-10-CM

## 2020-01-12 LAB — POCT GLYCOSYLATED HEMOGLOBIN (HGB A1C): Hemoglobin A1C: 5.6 % (ref 4.0–5.6)

## 2020-01-12 LAB — GLUCOSE, CAPILLARY: Glucose-Capillary: 113 mg/dL — ABNORMAL HIGH (ref 70–99)

## 2020-01-12 MED ORDER — ATORVASTATIN CALCIUM 40 MG PO TABS: ORAL_TABLET | ORAL | 0 refills | Status: DC

## 2020-01-12 MED ORDER — VARENICLINE TARTRATE 0.5 MG X 11 & 1 MG X 42 PO MISC: ORAL | 0 refills | Status: DC

## 2020-01-12 NOTE — Assessment & Plan Note (Addendum)
Incidentally noted 82mm LLL lung nodule noted on CT in 2019. Nodule on further characterized on radiology report.  Patient has a 25 pack year history. Denies night sweats, unintentional weight loss, hemoptysis or cough. Discussed need for follow up on this for which patient is agreeable. Plan: will obtain a CT chest without contrast

## 2020-01-12 NOTE — Assessment & Plan Note (Addendum)
59 yo male that is due for colonoscopy. Denies melena, hematchezia or bowel changes. Notes he was sent, what sounds like, a FIT test last year by his insurance company last year however we do not have those results. Discussed risk/benefits of colon cancer screening options. He is electing for the FIT test and understands he will he a diagnostic colonoscopy if is positive. Plan: FIT test ordered.

## 2020-01-12 NOTE — Progress Notes (Signed)
[id number]

## 2020-01-13 ENCOUNTER — Telehealth: Payer: Self-pay | Admitting: *Deleted

## 2020-01-13 DIAGNOSIS — R109 Unspecified abdominal pain: Secondary | ICD-10-CM | POA: Insufficient documentation

## 2020-01-13 NOTE — Addendum Note (Signed)
Addended by: Elige Radon on: 01/13/2020 10:31 AM   Modules accepted: Orders

## 2020-01-13 NOTE — Assessment & Plan Note (Addendum)
Notes some diffuse mild abdominal pain that started last week and is now improving. Was associated with some nausea that has since resolved. Recent COVID negative x2 Assessment: likely a viral process. Plan: will continue to monitor and can evaluate further if symptoms return or do not continue to improve

## 2020-01-13 NOTE — Progress Notes (Signed)
CC: rash  HPI:  [name] is a 59 y.o. male who presents for establishment of care with me and to discuss a chronic rash.   Please see problem based assessment and plan for additional details.   Past Medical History:  Diagnosis Date  . Asthma    childhood  . Blindness of both eyes 2011   after sick with high fever, went blind after    Review of Systems:  Review of Systems - General ROS: negative for - chills, fever, night sweats or weight loss Respiratory ROS: no cough, shortness of breath, or wheezing Cardiovascular ROS: no chest pain or dyspnea on exertion Gastrointestinal ROS: positive for - abdominal pain Dermatological ROS: positive for rash   Physical Exam:  Vitals:   01/12/20 1016  BP: (!) 116/58  Pulse: 73  Temp: 98.7 F (37.1 C)  TempSrc: Oral  SpO2: 97%  Weight: 206 lb 12.8 oz (93.8 kg)  Height: 5\' 6"  (1.676 m)    GENERAL: well appearing, in no apparent distress CARDIAC: heart regular rate and rhythm PULMONARY: lung sounds clear to auscultation SKIN: diffuse papular rash involving his arms, legs and torso. Papules are scabbed over but without drainage.  Assessment & Plan:   1. Papular rash. Cesario notes that he was following with dermatology previously for this and they apparently prescribed a steroid ointment for which he brings to the office with him today.  He notes that this has been going on for quite some time and is puritic in nature. Endoreses improvement with the steroid cream which he uses over his entire body. Denies aggrevating symptoms. Requests refill on steroid cream. Unfortunately, we do not currently have records from that dermatologist.  He also notes that he was prescribed doxycycline for a staph infection "all over" from the dermatologist. He is requesting a refill of that as well. PE reveals a papular rash involving his legs, arms, back and chest. The papules are scabbed over on top but have no apparent drainage.  Assessment:  unclear etiology however would suspect an atopic dermatitis since he says the steroid cream is working. He is using the steroid ointment daily which is concerning since his prescription has a date from last year. Other considerations would be bed bugs or porphoryia cutanea tarda however rash looks less so like this and LFTs were normal a few months ago. Regarding the staph infection, the rash does not look related to that. Plan: will work on getting records from his dermatologist--signed ROI. Will hold off on refilling the doxycycline and steroid until we get those records.   2. Hyperlipidemia. As seen on lipid panel from a few months ago. Patient notes that he never filled his prescription. We discussed importance of lowering his cholesterol to reduce is risk of MI and stroke. He implied understanding and agrees to start taking a statin. Assessment: ASCVD 10 year risk 20.7%--high risk. Plan: will start him off at 40mg  lipitor for a month and increase to 80mg  thereafter.  3. Lung nodule. Incidentally noted 9mm LLL lung nodule noted on CT in 2019. Nodule on further characterized on radiology report.  Patient has a 25 pack year history. Denies night sweats, unintentional weight loss, hemoptysis or cough. Discussed need for follow up on this for which patient is agreeable. Plan: will obtain a CT chest without contrast  4. Tobacco use. Patient notes an approximate 25 pack year tobacco smoking history. He is interested in smoking cessation. He notes that he was previously prescribed chantix  however the pharmacy told him insurance was not covering it and was $300 which he could not afford. Assessment: reviewed current medicaid formula which does show they would cover 8mo worth. Discussed this with patient and he is agreeable to trying to pick it up again. Plan: chantix resent to pharmacy.  5. Health maintenance:  due for colonoscopy. Denies melena, hematchezia or bowel changes. Notes he was sent, what  sounds like, a FIT test last year by his insurance company last year however we do not have those results. Discussed risk/benefits of colon cancer screening options. He is electing for the FIT test and understands he will he a diagnostic colonoscopy if is positive. Also obtained an A1C which is 5.6 today. FH is significant for DM in father and sister.  Plan: FIT test ordered. Recheck A1C in one year.  Patient is in agreement with the plan and endorses no further questions at this time.  Patient discussed with Dr. Marylyn Ishihara, MD Internal Medicine Resident-PGY1 01/13/20

## 2020-01-13 NOTE — Assessment & Plan Note (Signed)
William Hale notes that he was following with dermatology previously for this and they apparently prescribed a steroid ointment for which he brings to the office with him today.  He notes that this has been going on for quite some time and is puritic in nature. Endoreses improvement with the steroid cream which he uses over his entire body. Denies aggrevating symptoms. Requests refill on steroid cream. Unfortunately, we do not currently have records from that dermatologist.  He also notes that he was prescribed doxycycline for a staph infection "all over" from the dermatologist. He is requesting a refill of that as well. PE reveals a papular rash involving his legs, arms, back and chest. The papules are scabbed over on top but have no apparent drainage.  Assessment: unclear etiology however would suspect an atopic dermatitis since he says the steroid cream is working. He is using the steroid ointment daily which is concerning since his prescription has a date from last year. Other considerations would be bed bugs or porphoryia cutanea tarda however rash looks less so like this and LFTs were normal a few months ago. Regarding the staph infection, the rash does not look related to that. Plan: will work on getting records from his dermatologist--signed ROI. Will hold off on refilling the doxycycline and steroid until we get those records.

## 2020-01-13 NOTE — Assessment & Plan Note (Addendum)
As seen on lipid panel from a few months ago. Patient notes that he never filled his prescription. We discussed importance of lowering his cholesterol to reduce is risk of MI and stroke. He implied understanding and agrees to start taking a statin. Assessment: ASCVD 10 year risk 20.7%--high risk. Plan: will start him off at 40mg  lipitor for a month and increase to 80mg  thereafter.

## 2020-01-13 NOTE — Telephone Encounter (Addendum)
Information was sent to CoverMyMeds for PA for Atrovastatin.  Awaiting determination within 72 hours.  Angelina Ok, RN 01/13/2020 4:42 PM.  PA for Atorvastatin was approved 01/13/2020 thru 12/16/2020.  Angelina Ok, RN 01/14/2020 10:04 AM.

## 2020-01-13 NOTE — Assessment & Plan Note (Signed)
Patient notes an approximate 25 pack year tobacco smoking history. He is interested in smoking cessation. He notes that he was previously prescribed chantix however the pharmacy told him insurance was not covering it and was $300 which he could not afford. Assessment: reviewed current medicaid formula which does show they would cover 33mo worth. Discussed this with patient and he is agreeable to trying to pick it up again. Plan: chantix resent to pharmacy.

## 2020-01-18 NOTE — Progress Notes (Signed)
Internal Medicine Clinic Attending  Case discussed with Dr. Christian at the time of the visit.  We reviewed the resident's history and exam and pertinent patient test results.  I agree with the assessment, diagnosis, and plan of care documented in the resident's note.    

## 2020-02-12 ENCOUNTER — Other Ambulatory Visit: Payer: Self-pay

## 2020-02-12 ENCOUNTER — Ambulatory Visit (HOSPITAL_COMMUNITY)
Admission: RE | Admit: 2020-02-12 | Discharge: 2020-02-12 | Disposition: A | Discharge status: Home / Self Care | Payer: Medicare Other | Source: Ambulatory Visit | Attending: Internal Medicine | Admitting: Internal Medicine

## 2020-02-12 DIAGNOSIS — R911 Solitary pulmonary nodule: Secondary | ICD-10-CM

## 2020-02-12 DIAGNOSIS — J9809 Other diseases of bronchus, not elsewhere classified: Secondary | ICD-10-CM | POA: Diagnosis not present

## 2020-02-12 MED ORDER — IOHEXOL 300 MG/ML  SOLN
100.0000 mL | Freq: Once | INTRAMUSCULAR | Status: AC | PRN
  Administered 2020-02-12: 100 mL via INTRAVENOUS

## 2020-02-24 DIAGNOSIS — L299 Pruritus, unspecified: Secondary | ICD-10-CM | POA: Diagnosis not present

## 2020-03-03 ENCOUNTER — Ambulatory Visit: Payer: Medicare Other | Attending: Internal Medicine

## 2020-03-03 DIAGNOSIS — Z23 Encounter for immunization: Secondary | ICD-10-CM

## 2020-03-03 NOTE — Progress Notes (Signed)
   Covid-19 Vaccination Clinic  Name:  William Hale    MRN: [id number] DOB: [birth year]  03/03/2020  [name] was observed post Covid-19 immunization for 15 minutes without incident. He was provided with Vaccine Information Sheet and instruction to access the V-Safe system.   [name] was instructed to call 911 with any severe reactions post vaccine: Marland Kitchen Difficulty breathing  . Swelling of face and throat  . A fast heartbeat  . A bad rash all over body  . Dizziness and weakness   Immunizations Administered    Name Date Dose VIS Date Route   Pfizer COVID-19 Vaccine 03/03/2020 10:13 AM 0.3 mL 11/27/2019 Intramuscular   Manufacturer: [employer], [employer]   Lot: [id number]   NDC: [address]-8

## 2020-03-28 ENCOUNTER — Ambulatory Visit: Payer: Medicare Other | Attending: Internal Medicine

## 2020-03-28 DIAGNOSIS — Z23 Encounter for immunization: Secondary | ICD-10-CM

## 2020-03-28 NOTE — Progress Notes (Signed)
   Covid-19 Vaccination Clinic  Name:  MYLAN SCHWARZ    MRN: [id number] DOB: [birth year]  03/28/2020  Mr. Leitz was observed post Covid-19 immunization for 15 minutes without incident. He was provided with Vaccine Information Sheet and instruction to access the V-Safe system.   Mr. Catherman was instructed to call 911 with any severe reactions post vaccine: Marland Kitchen Difficulty breathing  . Swelling of face and throat  . A fast heartbeat  . A bad rash all over body  . Dizziness and weakness   Immunizations Administered    Name Date Dose VIS Date Route   Pfizer COVID-19 Vaccine 03/28/2020  9:08 AM 0.3 mL 11/27/2019 Intramuscular   Manufacturer: [employer], [employer]   Lot: [id number]   NDC: [address]-5

## 2020-04-28 ENCOUNTER — Other Ambulatory Visit: Payer: Self-pay | Admitting: *Deleted

## 2020-04-28 DIAGNOSIS — E785 Hyperlipidemia, unspecified: Secondary | ICD-10-CM

## 2020-04-29 MED ORDER — ATORVASTATIN CALCIUM 40 MG PO TABS: ORAL_TABLET | ORAL | 0 refills | Status: DC

## 2020-09-06 ENCOUNTER — Other Ambulatory Visit: Payer: Self-pay

## 2020-09-06 ENCOUNTER — Ambulatory Visit (HOSPITAL_COMMUNITY)
Admission: EM | Admit: 2020-09-06 | Discharge: 2020-09-06 | Disposition: A | Discharge status: Home / Self Care | Payer: Medicare Other | Attending: Internal Medicine | Admitting: Internal Medicine

## 2020-09-06 ENCOUNTER — Encounter (HOSPITAL_COMMUNITY): Payer: Self-pay

## 2020-09-06 DIAGNOSIS — R197 Diarrhea, unspecified: Secondary | ICD-10-CM | POA: Diagnosis present

## 2020-09-06 DIAGNOSIS — H548 Legal blindness, as defined in USA: Secondary | ICD-10-CM | POA: Diagnosis not present

## 2020-09-06 DIAGNOSIS — A084 Viral intestinal infection, unspecified: Secondary | ICD-10-CM | POA: Insufficient documentation

## 2020-09-06 DIAGNOSIS — E669 Obesity, unspecified: Secondary | ICD-10-CM | POA: Diagnosis not present

## 2020-09-06 DIAGNOSIS — Z20822 Contact with and (suspected) exposure to covid-19: Secondary | ICD-10-CM | POA: Diagnosis not present

## 2020-09-06 DIAGNOSIS — Z79899 Other long term (current) drug therapy: Secondary | ICD-10-CM | POA: Insufficient documentation

## 2020-09-06 DIAGNOSIS — E785 Hyperlipidemia, unspecified: Secondary | ICD-10-CM | POA: Diagnosis not present

## 2020-09-06 DIAGNOSIS — F17210 Nicotine dependence, cigarettes, uncomplicated: Secondary | ICD-10-CM | POA: Diagnosis not present

## 2020-09-06 LAB — SARS CORONAVIRUS 2 (TAT 6-24 HRS): SARS Coronavirus 2: NEGATIVE

## 2020-09-06 MED ORDER — PANTOPRAZOLE SODIUM 20 MG PO TBEC: 20.0000 mg | DELAYED_RELEASE_TABLET | Freq: Every day | ORAL | 0 refills | Status: DC

## 2020-09-06 MED ORDER — ONDANSETRON 4 MG PO TBDP: 4.0000 mg | ORAL_TABLET | Freq: Once | ORAL | Status: DC

## 2020-09-06 MED ORDER — ONDANSETRON 4 MG PO TBDP: 4.0000 mg | ORAL_TABLET | Freq: Three times a day (TID) | ORAL | 0 refills | Status: DC | PRN

## 2020-09-06 NOTE — ED Triage Notes (Signed)
Pt is here with abdominal pain and nausea, diarrhea that started Saturday, pt has taken Pepto bismol to relieve discomfort.

## 2020-09-06 NOTE — Discharge Instructions (Addendum)
Please quarantine until covid-19 test results are available. 

## 2020-09-06 NOTE — ED Provider Notes (Signed)
[facility]    CSN: [id number] Arrival date & time: 09/06/20  0850      History   Chief Complaint Chief Complaint  Patient presents with  . Abdominal Pain  . Diarrhea  . Nausea    HPI William Hale is a 59 y.o. male comes to the urgent care with complaints of generalized abdominal pain, nausea, vomiting and diarrhea which started 3 to 4 days ago.  Patient's diet has remained unchanged throughout the week.  He has not traveled or eaten outside.  Symptoms started on Saturday and has been persistent.  The abdominal pain is dull, poorly localized and associated with nausea and diarrhea.  He took Pepto-Bismol with some improvement.   No fever or chills.  No loss of taste or smell.  Patient is vaccinated against COVID-19 virus.  HPI  Past Medical History:  Diagnosis Date  . Asthma    childhood  . Blindness of both eyes 2011   after sick with high fever, went blind after    Patient Active Problem List   Diagnosis Date Noted  . Abdominal pain 01/13/2020  . Lung nodule 01/12/2020  . Colon cancer screening 01/12/2020  . Tobacco use 10/12/2019  . Hyperlipidemia 09/26/2017  . Rash and nonspecific skin eruption 09/26/2017  . Obesity 09/13/2017  . Blindness of both eyes 12/17/2009    Past Surgical History:  Procedure Laterality Date  . CYST EXCISION Right age 17   R elbow cyst removal       Home Medications    Prior to Admission medications   Medication Sig Start Date End Date Taking? Authorizing Provider  atorvastatin (LIPITOR) 40 MG tablet Take 1 tablet (40 mg total) by mouth daily for 30 days, THEN 2 tablets (80 mg total) daily. 04/29/20 07/28/20  Elige Radon, MD  ondansetron (ZOFRAN ODT) 4 MG disintegrating tablet Take 1 tablet (4 mg total) by mouth every 8 (eight) hours as needed for nausea or vomiting. 09/06/20   Nga Rabon, Britta Mccreedy, MD  pantoprazole (PROTONIX) 20 MG tablet Take 1 tablet (20 mg total) by mouth daily. 09/06/20   LampteyBritta Mccreedy, MD    triamcinolone cream (KENALOG) 0.1 % Apply 1 application topically 2 (two) times daily. 09/26/17   Leland Her, DO  varenicline (CHANTIX PAK) 0.5 MG X 11 & 1 MG X 42 tablet Take one 0.5 mg tablet by mouth once daily for 3 days, then increase to one 0.5 mg tablet twice daily for 4 days, then increase to one 1 mg tablet twice daily. 01/12/20   Elige Radon, MD    Family History Family History  Problem Relation Age of Onset  . Healthy Mother   . Heart disease Father   . Diabetes Father   . Emphysema Maternal Grandfather   . Diabetes Cousin     Social History Social History   Tobacco Use  . Smoking status: Current Every Day Smoker    Packs/day: 0.50    Years: 20.00    Pack years: 10.00    Types: Cigarettes  . Smokeless tobacco: Never Used  . Tobacco comment: trying   Vaping Use  . Vaping Use: Never used  Substance Use Topics  . Alcohol use: Yes    Comment: occasionally, on holidays  . Drug use: No     Allergies   Patient has no known allergies.   Review of Systems Review of Systems  Constitutional: Negative.   Respiratory: Negative.   Gastrointestinal: Positive for abdominal pain, diarrhea, nausea  and vomiting.  Genitourinary: Negative.   Musculoskeletal: Negative.   Neurological: Negative.      Physical Exam Triage Vital Signs ED Triage Vitals  Enc Vitals Group     BP 09/06/20 0927 124/74     Pulse Rate 09/06/20 0927 62     Resp 09/06/20 0927 18     Temp 09/06/20 0927 98.4 F (36.9 C)     Temp Source 09/06/20 0927 Oral     SpO2 09/06/20 0927 95 %     Weight --      Height --      Head Circumference --      Peak Flow --      Pain Score 09/06/20 0939 7     Pain Loc --      Pain Edu? --      Excl. in GC? --    No data found.  Updated Vital Signs BP 124/74 (BP Location: Right Arm)   Pulse 62   Temp 98.4 F (36.9 C) (Oral)   Resp 18   SpO2 95%   Visual Acuity Right Eye Distance:   Left Eye Distance:   Bilateral Distance:    Right Eye  Near:   Left Eye Near:    Bilateral Near:     Physical Exam Vitals and nursing note reviewed.  Constitutional:      General: He is not in acute distress.    Appearance: He is well-developed. He is not ill-appearing.  Eyes:     Extraocular Movements: Extraocular movements intact.  Pulmonary:     Effort: Pulmonary effort is normal.     Breath sounds: Normal breath sounds.  Abdominal:     General: Bowel sounds are normal.     Palpations: Abdomen is rigid.     Tenderness: There is no abdominal tenderness.     Hernia: No hernia is present.  Neurological:     Mental Status: He is alert.      UC Treatments / Results  Labs (all labs ordered are listed, but only abnormal results are displayed) Labs Reviewed  SARS CORONAVIRUS 2 (TAT 6-24 HRS)    EKG   Radiology No results found.  Procedures Procedures (including critical care time)  Medications Ordered in UC Medications  ondansetron (ZOFRAN-ODT) disintegrating tablet 4 mg (has no administration in time range)    Initial Impression / Assessment and Plan / UC Course  I have reviewed the triage vital signs and the nursing notes.  Pertinent labs & imaging results that were available during my care of the patient were reviewed by me and considered in my medical decision making (see chart for details).     1.  Viral gastroenteritis: COVID-19 PCR test taken Zofran as needed for nausea/vomiting Patient encouraged to push oral fluids If symptoms worsen patient can return to urgent care. Final Clinical Impressions(s) / UC Diagnoses   Final diagnoses:  Viral gastroenteritis     Discharge Instructions     Please quarantine until covid-19 test results are available.   ED Prescriptions    Medication Sig Dispense Auth. Provider   ondansetron (ZOFRAN ODT) 4 MG disintegrating tablet Take 1 tablet (4 mg total) by mouth every 8 (eight) hours as needed for nausea or vomiting. 20 tablet Adeline Petitfrere, Britta Mccreedy, MD   pantoprazole  (PROTONIX) 20 MG tablet Take 1 tablet (20 mg total) by mouth daily. 30 tablet Shamera Yarberry, Britta Mccreedy, MD     PDMP not reviewed this encounter.   Merrilee Jansky, MD 09/06/20  1424  

## 2021-08-09 ENCOUNTER — Emergency Department (HOSPITAL_COMMUNITY): Payer: Medicare Other

## 2021-08-09 ENCOUNTER — Encounter (HOSPITAL_COMMUNITY): Payer: Self-pay

## 2021-08-09 ENCOUNTER — Other Ambulatory Visit: Payer: Self-pay

## 2021-08-09 ENCOUNTER — Emergency Department (HOSPITAL_COMMUNITY)
Admission: EM | Admit: 2021-08-09 | Discharge: 2021-08-09 | Disposition: A | Discharge status: Home / Self Care | Payer: Medicare Other | Attending: Emergency Medicine | Admitting: Emergency Medicine

## 2021-08-09 DIAGNOSIS — M5412 Radiculopathy, cervical region: Secondary | ICD-10-CM | POA: Diagnosis not present

## 2021-08-09 DIAGNOSIS — R202 Paresthesia of skin: Secondary | ICD-10-CM | POA: Insufficient documentation

## 2021-08-09 DIAGNOSIS — J45909 Unspecified asthma, uncomplicated: Secondary | ICD-10-CM | POA: Insufficient documentation

## 2021-08-09 DIAGNOSIS — R55 Syncope and collapse: Secondary | ICD-10-CM

## 2021-08-09 DIAGNOSIS — F17210 Nicotine dependence, cigarettes, uncomplicated: Secondary | ICD-10-CM | POA: Diagnosis not present

## 2021-08-09 DIAGNOSIS — E860 Dehydration: Secondary | ICD-10-CM | POA: Diagnosis not present

## 2021-08-09 LAB — CBC
HCT: 42.6 % (ref 39.0–52.0)
Hemoglobin: 14.4 g/dL (ref 13.0–17.0)
MCH: 30.3 pg (ref 26.0–34.0)
MCHC: 33.8 g/dL (ref 30.0–36.0)
MCV: 89.7 fL (ref 80.0–100.0)
Platelets: 225 10*3/uL (ref 150–400)
RBC: 4.75 MIL/uL (ref 4.22–5.81)
RDW: 13.4 % (ref 11.5–15.5)
WBC: 5.7 10*3/uL (ref 4.0–10.5)
nRBC: 0 % (ref 0.0–0.2)

## 2021-08-09 LAB — URINALYSIS, ROUTINE W REFLEX MICROSCOPIC
Bilirubin Urine: NEGATIVE
Glucose, UA: NEGATIVE mg/dL
Ketones, ur: NEGATIVE mg/dL
Leukocytes,Ua: NEGATIVE
Nitrite: NEGATIVE
Protein, ur: NEGATIVE mg/dL
Specific Gravity, Urine: 1.024 (ref 1.005–1.030)
pH: 5 (ref 5.0–8.0)

## 2021-08-09 LAB — BASIC METABOLIC PANEL WITH GFR
Anion gap: 7 (ref 5–15)
BUN: 14 mg/dL (ref 6–20)
CO2: 25 mmol/L (ref 22–32)
Calcium: 8.8 mg/dL — ABNORMAL LOW (ref 8.9–10.3)
Chloride: 105 mmol/L (ref 98–111)
Creatinine, Ser: 0.88 mg/dL (ref 0.61–1.24)
GFR, Estimated: >60 mL/min
Glucose, Bld: 100 mg/dL — ABNORMAL HIGH (ref 70–99)
Potassium: 3.6 mmol/L (ref 3.5–5.1)
Sodium: 137 mmol/L (ref 135–145)

## 2021-08-09 LAB — TROPONIN I (HIGH SENSITIVITY)
Troponin I (High Sensitivity): 3 ng/L (ref <18)
Troponin I (High Sensitivity): 3 ng/L

## 2021-08-09 LAB — CBG MONITORING, ED: Glucose-Capillary: 84 mg/dL (ref 70–99)

## 2021-08-09 MED ORDER — SODIUM CHLORIDE 0.9 % IV BOLUS
1000.0000 mL | Freq: Once | INTRAVENOUS | Status: AC
  Administered 2021-08-09: 1000 mL via INTRAVENOUS

## 2021-08-09 MED ORDER — SODIUM CHLORIDE 0.9 % IV SOLN: INTRAVENOUS | Status: DC

## 2021-08-09 MED ORDER — PREDNISONE 10 MG (21) PO TBPK: ORAL_TABLET | Freq: Every day | ORAL | 0 refills | Status: DC

## 2021-08-09 MED ORDER — KETOROLAC TROMETHAMINE 30 MG/ML IJ SOLN
30.0000 mg | Freq: Once | INTRAMUSCULAR | Status: AC
  Administered 2021-08-09: 30 mg via INTRAVENOUS
  Filled 2021-08-09: qty 1

## 2021-08-09 MED ORDER — DEXAMETHASONE SODIUM PHOSPHATE 10 MG/ML IJ SOLN
10.0000 mg | Freq: Once | INTRAMUSCULAR | Status: AC
  Administered 2021-08-09: 10 mg via INTRAVENOUS
  Filled 2021-08-09: qty 1

## 2021-08-09 NOTE — ED Provider Notes (Signed)
[facility] EMERGENCY DEPARTMENT Provider Note   CSN: [id number] Arrival date & time: 08/09/21  6720     History Chief Complaint  Patient presents with   Near Syncope    William Hale is a 60 y.o. male.  Pt presents to the ED today with near syncope.  Pt said he did not feel well last night and felt some tingling in his left arm.  Pt went to work [employer]) and felt like he was going to pass out.  Pt denies any cp or sob.  No weakness.  He denies any recent n/v/d/fever.      Past Medical History:  Diagnosis Date   Asthma    childhood   Blindness of both eyes 2011   after sick with high fever, went blind after    Patient Active Problem List   Diagnosis Date Noted   Abdominal pain 01/13/2020   Lung nodule 01/12/2020   Colon cancer screening 01/12/2020   Tobacco use 10/12/2019   Hyperlipidemia 09/26/2017   Rash and nonspecific skin eruption 09/26/2017   Obesity 09/13/2017   Blindness of both eyes 12/17/2009    Past Surgical History:  Procedure Laterality Date   CYST EXCISION Right age 76   R elbow cyst removal       Family History  Problem Relation Age of Onset   Healthy Mother    Heart disease Father    Diabetes Father    Emphysema Maternal Grandfather    Diabetes Cousin     Social History   Tobacco Use   Smoking status: Every Day    Packs/day: 0.50    Years: 20.00    Pack years: 10.00    Types: Cigarettes   Smokeless tobacco: Never   Tobacco comments:    trying   Vaping Use   Vaping Use: Never used  Substance Use Topics   Alcohol use: Yes    Comment: occasionally, on holidays   Drug use: No    Home Medications Prior to Admission medications   Medication Sig Start Date End Date Taking? Authorizing Provider  acetaminophen (TYLENOL) 500 MG tablet Take 1,000 mg by mouth every 6 (six) hours as needed for mild pain.   Yes [provider]  predniSONE (STERAPRED UNI-PAK 21 TAB) 10 MG (21) TBPK tablet Take  by mouth daily. Take 6 tabs by mouth daily  for 2 days, then 5 tabs for 2 days, then 4 tabs for 2 days, then 3 tabs for 2 days, 2 tabs for 2 days, then 1 tab by mouth daily for 2 days 08/09/21  Yes Jacalyn Lefevre, MD    Allergies    Patient has no known allergies.  Review of Systems   Review of Systems  Neurological:  Positive for syncope.       Left arm paresthesias  All other systems reviewed and are negative.  Physical Exam Updated Vital Signs BP (!) 112/50   Pulse 61   Temp 98 F (36.7 C) (Oral)   Resp 11   Ht 5\' 6"  (1.676 m)   Wt 92.5 kg   SpO2 100%   BMI 32.93 kg/m   Physical Exam Vitals and nursing note reviewed.  Constitutional:      Appearance: Normal appearance.  HENT:     Head: Normocephalic and atraumatic.     Right Ear: External ear normal.     Left Ear: External ear normal.     Nose: Nose normal.  Mouth/Throat:     Mouth: Mucous membranes are moist.     Pharynx: Oropharynx is clear.  Eyes:     Comments: Right eye completely blind.  Left eye (with glasses) can see shapes and light  Cardiovascular:     Rate and Rhythm: Normal rate and regular rhythm.     Pulses: Normal pulses.     Heart sounds: Normal heart sounds.  Pulmonary:     Effort: Pulmonary effort is normal.     Breath sounds: Normal breath sounds.  Abdominal:     General: Abdomen is flat. Bowel sounds are normal.     Palpations: Abdomen is soft.  Musculoskeletal:        General: Normal range of motion.     Cervical back: Normal range of motion and neck supple.  Skin:    General: Skin is warm.     Capillary Refill: Capillary refill takes less than 2 seconds.  Neurological:     General: No focal deficit present.     Mental Status: He is alert and oriented to person, place, and time.  Psychiatric:        Mood and Affect: Mood normal.        Behavior: Behavior normal.        Thought Content: Thought content normal.        Judgment: Judgment normal.    ED Results / Procedures /  Treatments   Labs (all labs ordered are listed, but only abnormal results are displayed) Labs Reviewed  BASIC METABOLIC PANEL - Abnormal; Notable for the following components:      Result Value   Glucose, Bld 100 (*)    Calcium 8.8 (*)    All other components within normal limits  URINALYSIS, ROUTINE W REFLEX MICROSCOPIC - Abnormal; Notable for the following components:   APPearance HAZY (*)    Hgb urine dipstick SMALL (*)    Bacteria, UA RARE (*)    All other components within normal limits  CBC  CBG MONITORING, ED  TROPONIN I (HIGH SENSITIVITY)  TROPONIN I (HIGH SENSITIVITY)    EKG EKG Interpretation  Date/Time:  Wednesday August 09 2021 09:17:49 EDT Ventricular Rate:  64 PR Interval:  165 QRS Duration: 99 QT Interval:  423 QTC Calculation: 437 R Axis:   79 Text Interpretation: Sinus rhythm Low voltage, precordial leads No significant change since last tracing Confirmed by Jacalyn Lefevre [phone]) on 08/09/2021 9:22:22 AM  Radiology DG Chest Port 1 View  Result Date: 08/09/2021 CLINICAL DATA:  60 year old male with near syncope. Left upper extremity tingling. EXAM: PORTABLE CHEST 1 VIEW COMPARISON:  Chest CT 02/12/2020 and earlier. FINDINGS: Portable AP upright view at 0942 hours. Lung volumes and mediastinal contours are stable and within normal limits. Visualized tracheal air column is within normal limits. Allowing for portable technique the lungs are clear. No pneumothorax. No acute osseous abnormality identified. IMPRESSION: Negative portable chest. Electronically Signed   By: Odessa Fleming M.D.   On: 08/09/2021 10:08    Procedures Procedures   Medications Ordered in ED Medications  sodium chloride 0.9 % bolus 1,000 mL (0 mLs Intravenous Stopped 08/09/21 1137)    And  0.9 %  sodium chloride infusion ( Intravenous Stopped 08/09/21 1352)  ketorolac (TORADOL) 30 MG/ML injection 30 mg (30 mg Intravenous Given 08/09/21 1242)  dexamethasone (DECADRON) injection 10 mg (10 mg  Intravenous Given 08/09/21 1243)    ED Course  I have reviewed the triage vital signs and the nursing notes.  Pertinent  labs & imaging results that were available during my care of the patient were reviewed by me and considered in my medical decision making (see chart for details).    MDM Rules/Calculators/A&P                           Pt is feeling better after fluids.    Left arm paresthesias are likely from a pinched cervical nerve.   Pt is stable for d/c.  He is instructed to return if worse.  F/u with pcp. Final Clinical Impression(s) / ED Diagnoses Final diagnoses:  Near syncope  Cervical radiculopathy  Dehydration    Rx / DC Orders ED Discharge Orders          Ordered    predniSONE (STERAPRED UNI-PAK 21 TAB) 10 MG (21) TBPK tablet  Daily        08/09/21 1304             Jacalyn Lefevre, MD 08/09/21 1428

## 2021-08-09 NOTE — Discharge Planning (Signed)
RNCM consulted regarding transportation needs for pt.  RNCM provided [facility] as pt has no transportation from hospital.  RNCM presented and explained [facility] Waiver and Release of Liability Form.  Pt signed waiver, therefore agreeing to written terms.   

## 2021-08-09 NOTE — ED Notes (Signed)
Got patent into a gown on the monitor did ekg shown to Dr Particia Nearing patient is resting with call bell in reach

## 2021-08-09 NOTE — ED Notes (Signed)
RN reviewed discharge instructions w/ pt. Follow up and prescriptions reviewed. Pt left ED w/ [employer] arranged by Social Work.

## 2021-08-09 NOTE — ED Notes (Signed)
RN contacted social work to arrange transport back to work, [employer] are there.

## 2021-08-09 NOTE — ED Triage Notes (Signed)
Pt BIB [facility] from work, pt works for [employer], pt is legally blind. Pt reports he started not feeling well in the middle of the night, tingling feeling in his Left arm from finger tips to shoulder. As pt arrived to work felt like he was going to pass out, assisted to the chair, did not hit floor, no LOC  Stroke screen negative for any weakness, no facial droop or slurred speech.  Pt does not drink a lot of water   BP 108/60 HR 66 CBG 99

## 2021-08-09 NOTE — ED Notes (Signed)
Pt given water and crackers  

## 2021-09-25 ENCOUNTER — Ambulatory Visit (HOSPITAL_COMMUNITY)
Admission: EM | Admit: 2021-09-25 | Discharge: 2021-09-25 | Disposition: A | Discharge status: Home / Self Care | Payer: Medicare Other | Attending: Emergency Medicine | Admitting: Emergency Medicine

## 2021-09-25 ENCOUNTER — Other Ambulatory Visit: Payer: Self-pay

## 2021-09-25 ENCOUNTER — Encounter (HOSPITAL_COMMUNITY): Payer: Self-pay

## 2021-09-25 DIAGNOSIS — M778 Other enthesopathies, not elsewhere classified: Secondary | ICD-10-CM | POA: Diagnosis not present

## 2021-09-25 MED ORDER — PREDNISONE 20 MG PO TABS: 40.0000 mg | ORAL_TABLET | Freq: Every day | ORAL | 0 refills | Status: AC

## 2021-09-25 MED ORDER — KETOROLAC TROMETHAMINE 30 MG/ML IJ SOLN
30.0000 mg | Freq: Once | INTRAMUSCULAR | Status: AC
  Administered 2021-09-25: 30 mg via INTRAMUSCULAR

## 2021-09-25 MED ORDER — KETOROLAC TROMETHAMINE 30 MG/ML IJ SOLN
INTRAMUSCULAR | Status: AC
  Filled 2021-09-25: qty 1

## 2021-09-25 NOTE — Discharge Instructions (Addendum)
Take the prednisone daily starting tomorrow.  Take it in the morning.   You can also take Tylenol as needed for pain relief.   You can use heat, ice, or alternate between heat and ice for comfort.  You can use IcyHot, lidocaine patches, biofreeze, aspercreme, or voltaren gel as needed for pain relief.   Follow up with your doctor on 10/04/21 as scheduled.

## 2021-09-25 NOTE — ED Triage Notes (Signed)
Pt presents with right shoulder pain X 1 week.

## 2021-09-25 NOTE — ED Provider Notes (Signed)
[facility]    CSN: [id number] Arrival date & time: 09/25/21  0957      History   Chief Complaint Chief Complaint  Patient presents with   Shoulder Pain    HPI William Hale is a 60 y.o. male.   Patient here for evaluation of right shoulder pain that has been ongoing for the past week.  Reports having similar pain in left shoulder several months ago and was told it was a pinched nerve.  Reports pain worse with movement and over the weekend felt a pop.  Has not taken any OTC medications or treatments.  Reports occasional numbness and tingling in his fingers.  Denies any trauma, injury, or other precipitating event.  Denies any fevers, chest pain, shortness of breath, N/V/D, weakness, abdominal pain, or headaches.    The history is provided by the patient.  Shoulder Pain  Past Medical History:  Diagnosis Date   Asthma    childhood   Blindness of both eyes 2011   after sick with high fever, went blind after    Patient Active Problem List   Diagnosis Date Noted   Abdominal pain 01/13/2020   Lung nodule 01/12/2020   Colon cancer screening 01/12/2020   Tobacco use 10/12/2019   Hyperlipidemia 09/26/2017   Rash and nonspecific skin eruption 09/26/2017   Obesity 09/13/2017   Blindness of both eyes 12/17/2009    Past Surgical History:  Procedure Laterality Date   CYST EXCISION Right age 59   R elbow cyst removal       Home Medications    Prior to Admission medications   Medication Sig Start Date End Date Taking? Authorizing Provider  predniSONE (DELTASONE) 20 MG tablet Take 2 tablets (40 mg total) by mouth daily for 5 days. 09/25/21 09/30/21 Yes Ivette Loyal, NP  acetaminophen (TYLENOL) 500 MG tablet Take 1,000 mg by mouth every 6 (six) hours as needed for mild pain.    [provider]    Family History Family History  Problem Relation Age of Onset   Healthy Mother    Heart disease Father    Diabetes Father    Emphysema Maternal  Grandfather    Diabetes Cousin     Social History Social History   Tobacco Use   Smoking status: Every Day    Packs/day: 0.50    Years: 20.00    Pack years: 10.00    Types: Cigarettes   Smokeless tobacco: Never   Tobacco comments:    trying   Vaping Use   Vaping Use: Never used  Substance Use Topics   Alcohol use: Yes    Comment: occasionally, on holidays   Drug use: No     Allergies   Patient has no known allergies.   Review of Systems Review of Systems  Musculoskeletal:  Positive for arthralgias.  All other systems reviewed and are negative.   Physical Exam Triage Vital Signs ED Triage Vitals  Enc Vitals Group     BP 09/25/21 1147 120/77     Pulse Rate 09/25/21 1147 60     Resp 09/25/21 1147 18     Temp 09/25/21 1147 98.1 F (36.7 C)     Temp Source 09/25/21 1147 Oral     SpO2 09/25/21 1147 97 %     Weight --      Height --      Head Circumference --      Peak Flow --      Pain Score  09/25/21 1146 10     Pain Loc --      Pain Edu? --      Excl. in GC? --    No data found.  Updated Vital Signs BP 120/77 (BP Location: Left Arm)   Pulse 60   Temp 98.1 F (36.7 C) (Oral)   Resp 18   SpO2 97%   Visual Acuity Right Eye Distance:   Left Eye Distance:   Bilateral Distance:    Right Eye Near:   Left Eye Near:    Bilateral Near:     Physical Exam Vitals and nursing note reviewed.  Constitutional:      General: He is not in acute distress.    Appearance: Normal appearance. He is not ill-appearing, toxic-appearing or diaphoretic.  HENT:     Head: Normocephalic and atraumatic.  Eyes:     Conjunctiva/sclera: Conjunctivae normal.  Cardiovascular:     Rate and Rhythm: Normal rate.     Pulses: Normal pulses.  Pulmonary:     Effort: Pulmonary effort is normal.  Abdominal:     General: Abdomen is flat.  Musculoskeletal:     Right shoulder: Tenderness present. No swelling, deformity, bony tenderness or crepitus. Decreased range of motion (full  passive ROM, decreased active ROM due t pain). Normal strength. Normal pulse.     Cervical back: Normal range of motion.  Skin:    General: Skin is warm and dry.  Neurological:     General: No focal deficit present.     Mental Status: He is alert and oriented to person, place, and time.  Psychiatric:        Mood and Affect: Mood normal.     UC Treatments / Results  Labs (all labs ordered are listed, but only abnormal results are displayed) Labs Reviewed - No data to display  EKG   Radiology No results found.  Procedures Procedures (including critical care time)  Medications Ordered in UC Medications  ketorolac (TORADOL) 30 MG/ML injection 30 mg (has no administration in time range)    Initial Impression / Assessment and Plan / UC Course  I have reviewed the triage vital signs and the nursing notes.  Pertinent labs & imaging results that were available during my care of the patient were reviewed by me and considered in my medical decision making (see chart for details).    Assessment negative for red flags or concerns.  Likely shoulder tendinitis versus cervical radiculopathy.  Ketorolac IM administered in office.  Prednisone daily for the next 5 days starting tomorrow.  May take Tylenol as needed.  May use heat, ice, and OTC pain relievers as needed.  Follow-up with appointment previously scheduled for October 19. Final Clinical Impressions(s) / UC Diagnoses   Final diagnoses:  Shoulder tendonitis, right     Discharge Instructions      Take the prednisone daily starting tomorrow.  Take it in the morning.   You can also take Tylenol as needed for pain relief.   You can use heat, ice, or alternate between heat and ice for comfort.  You can use IcyHot, lidocaine patches, biofreeze, aspercreme, or voltaren gel as needed for pain relief.   Follow up with your doctor on 10/04/21 as scheduled.      ED Prescriptions     Medication Sig Dispense Auth. Provider    predniSONE (DELTASONE) 20 MG tablet Take 2 tablets (40 mg total) by mouth daily for 5 days. 10 tablet Ivette Loyal, NP  PDMP not reviewed this encounter.   Ivette Loyal, NP 09/25/21 [phone]

## 2021-09-28 IMAGING — CT CT CHEST W/ CM
2 of 4 series · 15 of 36 positions shown, 18 images · IV contrast (Omni 300)
Comparison: CT abdomen pelvis, 04/25/2018

CLINICAL DATA: Follow-up left lower lobe pulmonary nodule seen on
prior CT abdomen pelvis

EXAM:
CT CHEST WITH CONTRAST
TECHNIQUE: Multidetector CT imaging of the chest was performed during
intravenous contrast administration.
CONTRAST:  100mL OMNIPAQUE IOHEXOL 300 MG/ML  SOLN

[Series 3: chest with 2mm st · axial · 0.80mm/px · z∈[+1270,+1590]mm · 12 of 186 slices shown, 15 images]
[im 13/186  mediastinal]
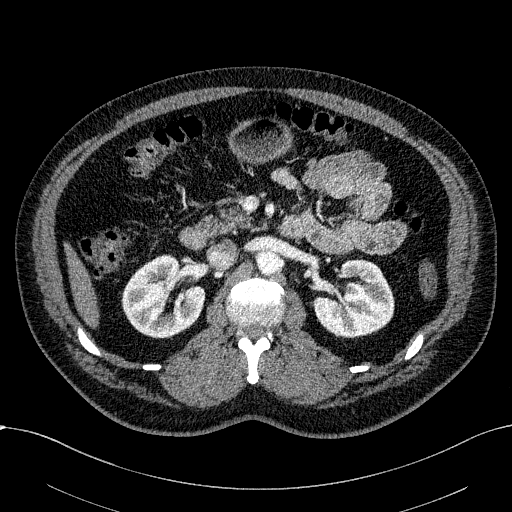
[im 13/186  lung]
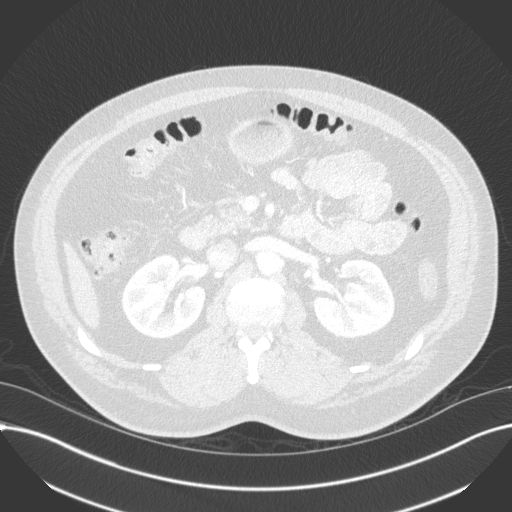
[im 25/186  lung]
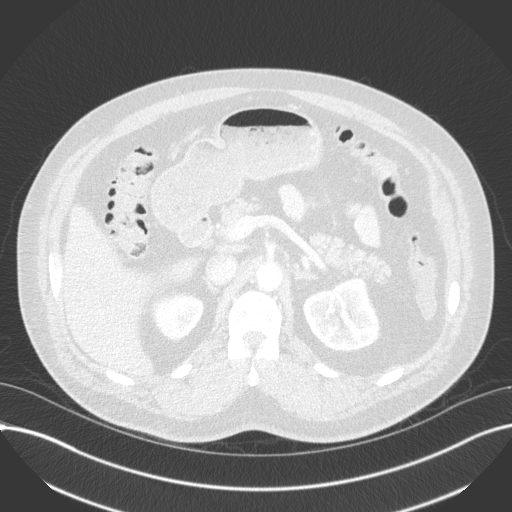
[im 38/186  lung]
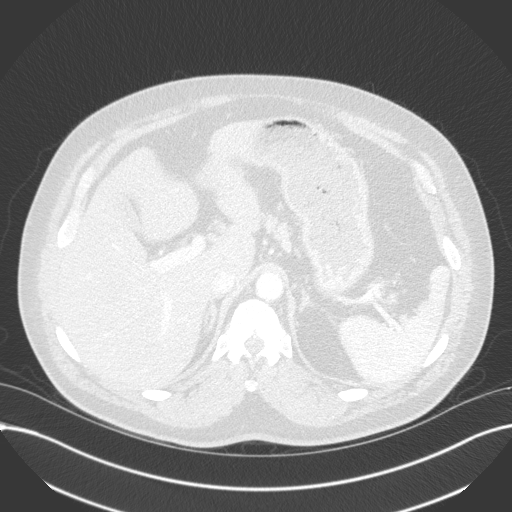
[im 62/186  lung]
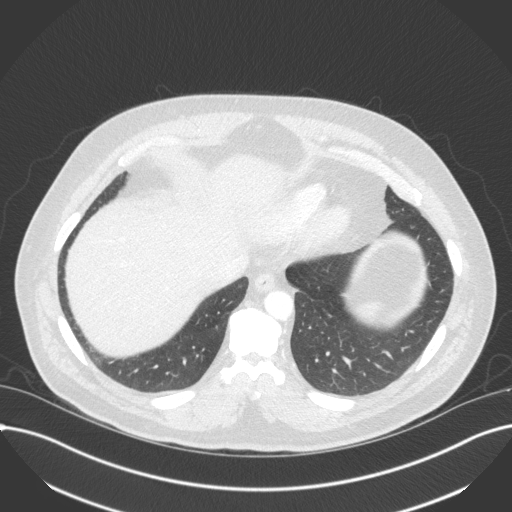
[im 75/186  mediastinal]
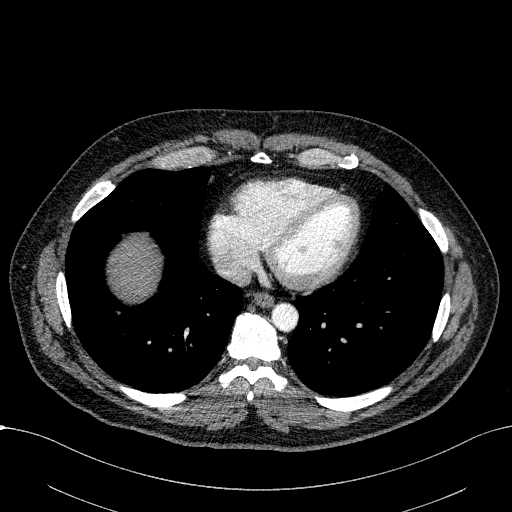
[im 75/186  lung]
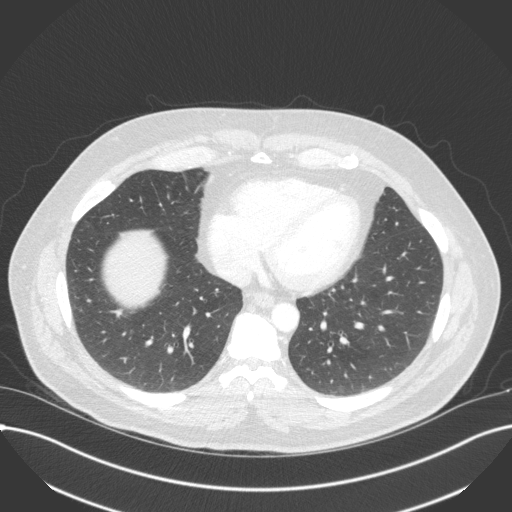
[im 87/186  lung]
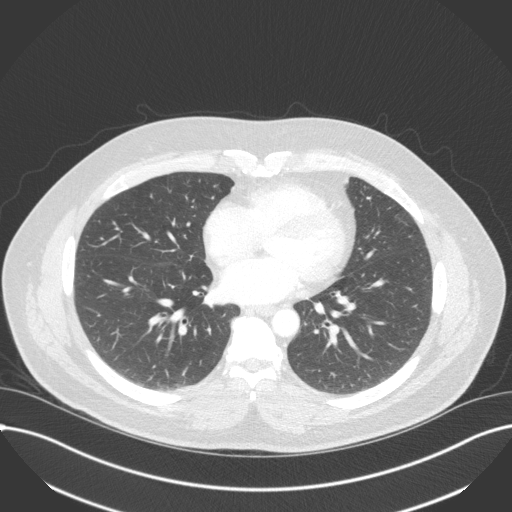
[im 99/186  lung]
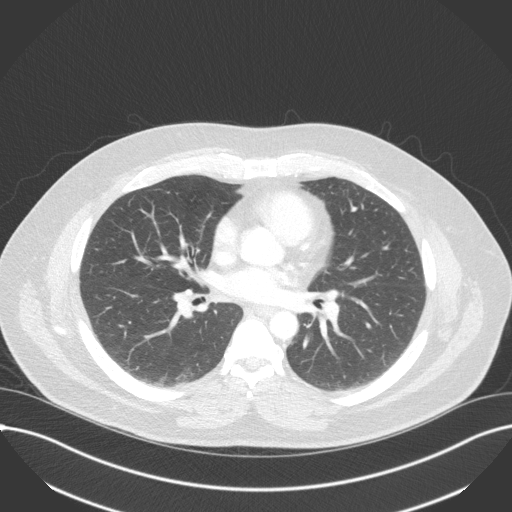
[im 112/186  lung]
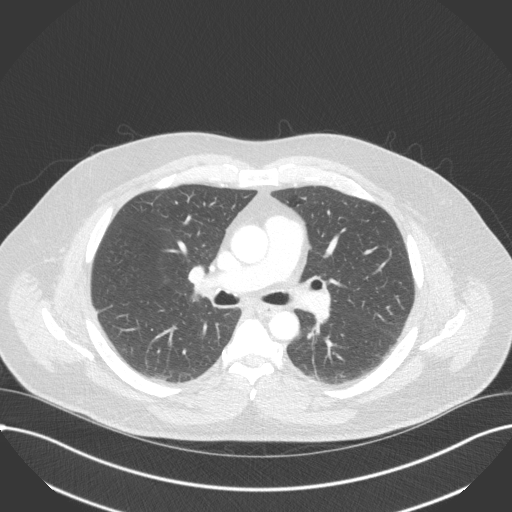
[im 124/186  mediastinal]
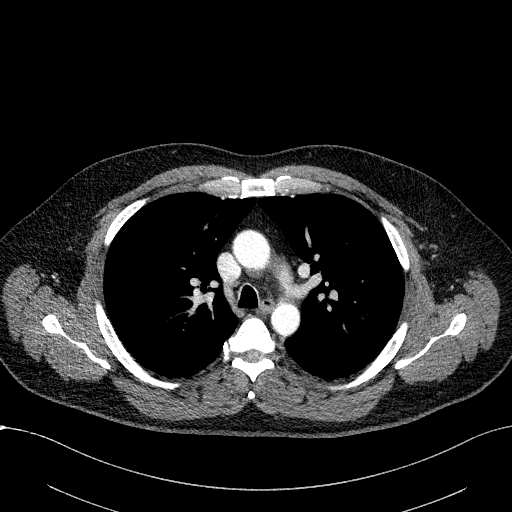
[im 124/186  lung]
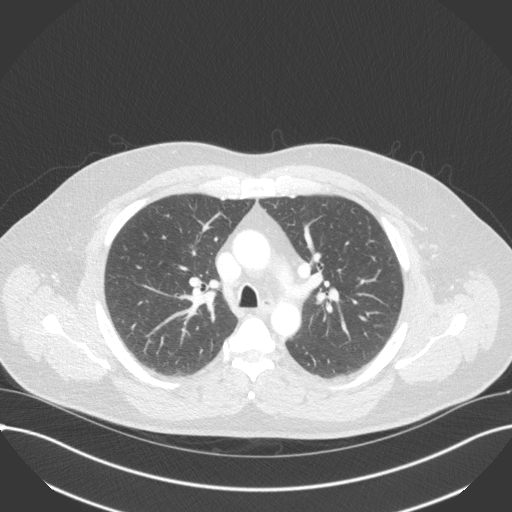
[im 149/186  lung]
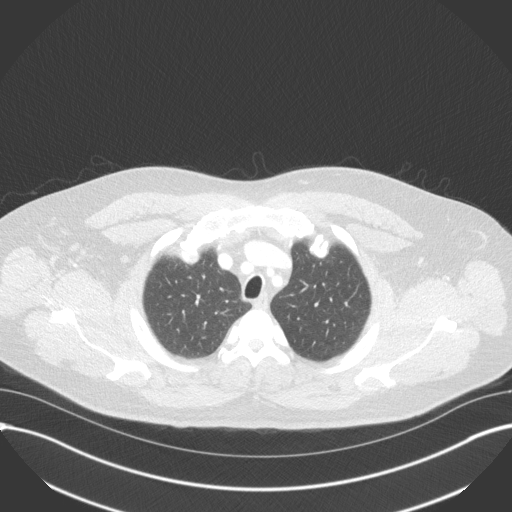
[im 161/186  lung]
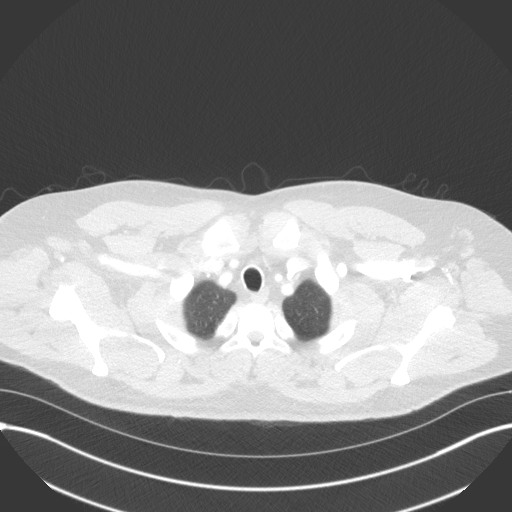
[im 173/186  lung]
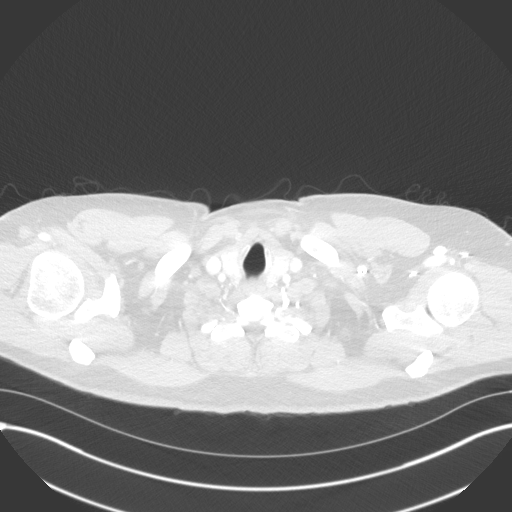

[Series 5: chest with 3mm st cor · coronal · 0.73mm/px · 3 of 94 slices shown]
[im 19/94  lung]
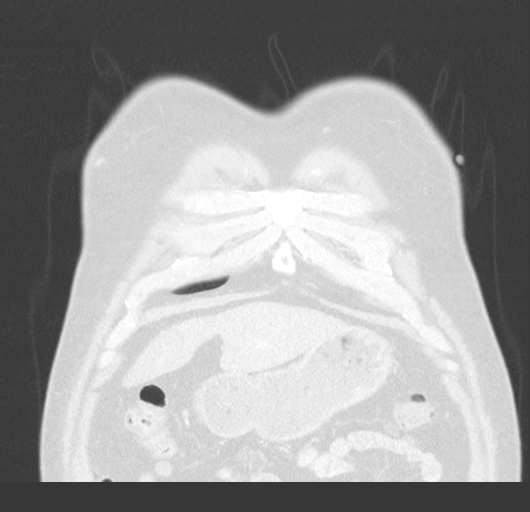
[im 38/94  lung]
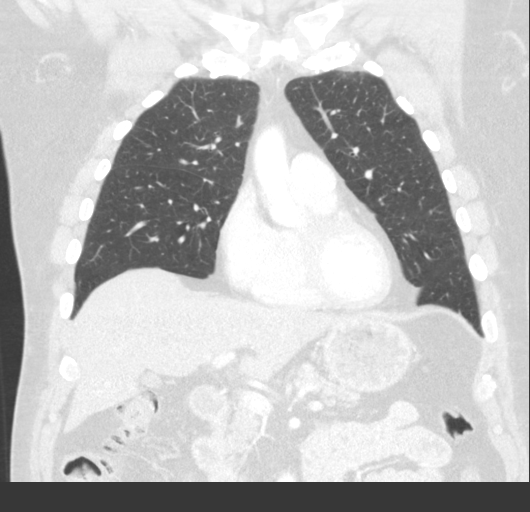
[im 56/94  lung]
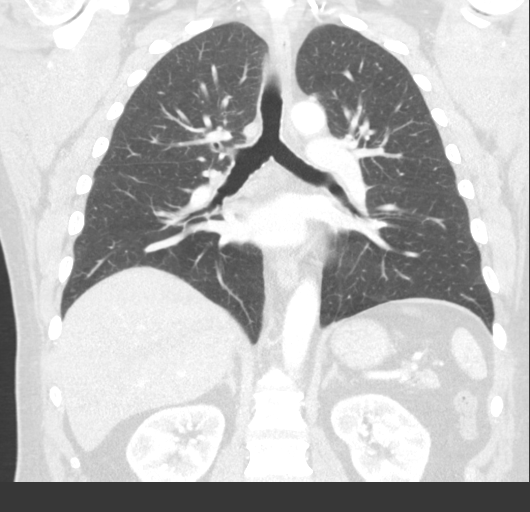

[15 of 36 positions shown; findings below may reference images not displayed]

FINDINGS: Cardiovascular: Scattered aortic atherosclerosis. Normal heart size.
Scattered coronary artery calcifications. No pericardial effusion.

Mediastinum/Nodes: No enlarged mediastinal, hilar, or axillary lymph
nodes. Thyroid gland, trachea, and esophagus demonstrate no
significant findings.

Lungs/Pleura: No significant change in a 4 mm pulmonary nodule of
the pendant left lung base with some associated adjacent scarring or
atelectasis (series 4, image 119). Mild, diffuse bronchial wall
thickening. No pleural effusion or pneumothorax.

Upper Abdomen: No acute abnormality. Hepatic steatosis.

Musculoskeletal: No chest wall mass or suspicious bone lesions
identified.
IMPRESSION: 1. No significant change in a 4 mm pulmonary nodule of the left lung
base with some associated adjacent scarring or atelectasis. This
nodule is benign and no further follow-up is required.
2. Mild, diffuse bronchial wall thickening, consistent with
nonspecific infectious or inflammatory bronchitis.
3. Coronary artery calcifications. Aortic Atherosclerosis
([id number]).
4. Hepatic steatosis.

## 2021-10-24 ENCOUNTER — Ambulatory Visit: Payer: Self-pay

## 2021-10-24 ENCOUNTER — Other Ambulatory Visit: Payer: Self-pay | Admitting: Occupational Medicine

## 2021-10-24 ENCOUNTER — Other Ambulatory Visit: Payer: Self-pay

## 2021-10-24 DIAGNOSIS — M25572 Pain in left ankle and joints of left foot: Secondary | ICD-10-CM

## 2021-12-29 ENCOUNTER — Encounter: Payer: Self-pay | Admitting: Internal Medicine

## 2022-04-04 ENCOUNTER — Other Ambulatory Visit: Payer: Self-pay | Admitting: Internal Medicine

## 2022-04-04 DIAGNOSIS — Z0000 Encounter for general adult medical examination without abnormal findings: Secondary | ICD-10-CM

## 2022-04-05 ENCOUNTER — Other Ambulatory Visit: Payer: Self-pay | Admitting: Internal Medicine

## 2022-04-05 DIAGNOSIS — M542 Cervicalgia: Secondary | ICD-10-CM

## 2022-04-21 ENCOUNTER — Ambulatory Visit
Admission: RE | Admit: 2022-04-21 | Discharge: 2022-04-21 | Disposition: A | Discharge status: Home / Self Care | Payer: Medicare Other | Source: Ambulatory Visit | Attending: Internal Medicine | Admitting: Internal Medicine

## 2022-04-21 DIAGNOSIS — M542 Cervicalgia: Secondary | ICD-10-CM

## 2024-04-23 ENCOUNTER — Other Ambulatory Visit: Payer: Self-pay | Admitting: Internal Medicine

## 2024-04-23 DIAGNOSIS — Z0000 Encounter for general adult medical examination without abnormal findings: Secondary | ICD-10-CM

## 2024-04-23 DIAGNOSIS — Z122 Encounter for screening for malignant neoplasm of respiratory organs: Secondary | ICD-10-CM

## 2024-04-23 DIAGNOSIS — Z720 Tobacco use: Secondary | ICD-10-CM
